# Patient Record
Sex: Female | Born: 1985 | Race: Black or African American | Hispanic: No | Marital: Single | Smoking: Never smoker
Health system: Southern US, Community
[De-identification: ages and names within clinical notes are randomized; demographics above are authoritative.]

---

## 2008-03-06 ENCOUNTER — Inpatient Hospital Stay (HOSPITAL_COMMUNITY): Admission: AD | Admit: 2008-03-06 | Discharge: 2008-03-08 | Payer: Self-pay | Admitting: Obstetrics

## 2008-03-07 ENCOUNTER — Encounter (INDEPENDENT_AMBULATORY_CARE_PROVIDER_SITE_OTHER): Payer: Self-pay | Admitting: Obstetrics

## 2010-06-27 ENCOUNTER — Inpatient Hospital Stay (HOSPITAL_COMMUNITY): Admission: AD | Admit: 2010-06-27 | Discharge: 2010-06-27 | Payer: Self-pay | Admitting: Obstetrics & Gynecology

## 2010-06-27 ENCOUNTER — Ambulatory Visit: Payer: Self-pay | Admitting: Obstetrics and Gynecology

## 2010-11-25 LAB — URINE MICROSCOPIC-ADD ON

## 2010-11-25 LAB — URINALYSIS, ROUTINE W REFLEX MICROSCOPIC
Glucose, UA: NEGATIVE mg/dL
Ketones, ur: NEGATIVE mg/dL
Protein, ur: NEGATIVE mg/dL
Urobilinogen, UA: 1 mg/dL (ref 0.0–1.0)

## 2010-11-25 LAB — POCT PREGNANCY, URINE: Preg Test, Ur: POSITIVE

## 2011-01-26 NOTE — Op Note (Signed)
NAMETASHANA, HABERL                 ACCOUNT NO.:  [id number]   MEDICAL RECORD NO.:  [id number]          PATIENT TYPE:  INP   LOCATION:  9312                          FACILITY:  [facility]   PHYSICIAN:  Kathreen Cosier, M.D.DATE OF BIRTH:  03-Dec-1985   DATE OF PROCEDURE:  03/07/2008  DATE OF DISCHARGE:                               OPERATIVE REPORT   The patient is a 25 year old  primigravida 1, [facility] March 05, 2008, who is  brought in for induction on March 06, 2008 and pushed for an hour to +3  station and her quality of pushes was poor.  A vacuum was applied with  less pressure, 1+ restationed through 1 push to effect delivery.  LOA  female, nuchal cord which was loose cut prior to delivery.  Apgars were 2  and 7.  Team was called and present through the first-degree perineal  which was repaired with 2-0 Vicryl and the placenta was spontaneously  sent to pathology.           ______________________________  Kathreen Cosier, M.D.     BAM/MEDQ  D:  03/07/2008  T:  03/07/2008  Job:  [id number]

## 2011-01-28 ENCOUNTER — Inpatient Hospital Stay (HOSPITAL_COMMUNITY)
Admission: RE | Admit: 2011-01-28 | Discharge: 2011-01-30 | DRG: 775 | Disposition: A | Discharge status: Home / Self Care | Payer: Medicaid Other | Source: Ambulatory Visit | Attending: Obstetrics | Admitting: Obstetrics

## 2011-01-28 DIAGNOSIS — Z22330 Carrier of Group B streptococcus: Secondary | ICD-10-CM

## 2011-01-28 DIAGNOSIS — O99892 Other specified diseases and conditions complicating childbirth: Principal | ICD-10-CM | POA: Diagnosis present

## 2011-01-28 LAB — CBC
HCT: 34.1 % — ABNORMAL LOW (ref 36.0–46.0)
Hemoglobin: 10.8 g/dL — ABNORMAL LOW (ref 12.0–15.0)
MCHC: 31.7 g/dL (ref 30.0–36.0)
RBC: 4.34 MIL/uL (ref 3.87–5.11)
WBC: 11.5 10*3/uL — ABNORMAL HIGH (ref 4.0–10.5)

## 2011-01-28 LAB — RPR: RPR Ser Ql: NONREACTIVE

## 2011-01-29 LAB — CBC
HCT: 32.1 % — ABNORMAL LOW (ref 36.0–46.0)
Hemoglobin: 9.9 g/dL — ABNORMAL LOW (ref 12.0–15.0)
MCV: 79.1 fL (ref 78.0–100.0)
RBC: 4.06 MIL/uL (ref 3.87–5.11)
RDW: 14.5 % (ref 11.5–15.5)
WBC: 15.7 10*3/uL — ABNORMAL HIGH (ref 4.0–10.5)

## 2011-02-03 ENCOUNTER — Inpatient Hospital Stay (HOSPITAL_COMMUNITY): Admission: AD | Admit: 2011-02-03 | Payer: Self-pay | Source: Home / Self Care | Admitting: Obstetrics

## 2011-06-10 LAB — CCBB MATERNAL DONOR DRAW

## 2011-06-10 LAB — CBC
HCT: 32.8 — ABNORMAL LOW
HCT: 41.3
Hemoglobin: 13.7
MCHC: 33.3
MCHC: 33.5
MCV: 84
MCV: 84.9
Platelets: 174
RBC: 4.91
RDW: 14.5
WBC: 12.9 — ABNORMAL HIGH

## 2012-06-28 ENCOUNTER — Emergency Department (INDEPENDENT_AMBULATORY_CARE_PROVIDER_SITE_OTHER)
Admission: EM | Admit: 2012-06-28 | Discharge: 2012-06-28 | Disposition: A | Discharge status: Home / Self Care | Payer: Self-pay | Source: Home / Self Care | Attending: Family Medicine | Admitting: Family Medicine

## 2012-06-28 ENCOUNTER — Encounter (HOSPITAL_COMMUNITY): Payer: Self-pay

## 2012-06-28 DIAGNOSIS — L509 Urticaria, unspecified: Secondary | ICD-10-CM

## 2012-06-28 MED ORDER — HYDROXYZINE HCL 25 MG PO TABS: 25.0000 mg | ORAL_TABLET | Freq: Four times a day (QID) | ORAL | Status: DC

## 2012-06-28 MED ORDER — FAMOTIDINE 20 MG PO TABS: 20.0000 mg | ORAL_TABLET | Freq: Two times a day (BID) | ORAL | Status: DC

## 2012-06-28 MED ORDER — METHYLPREDNISOLONE ACETATE 80 MG/ML IJ SUSP
INTRAMUSCULAR | Status: AC
  Filled 2012-06-28: qty 1

## 2012-06-28 MED ORDER — METHYLPREDNISOLONE ACETATE 40 MG/ML IJ SUSP
80.0000 mg | Freq: Once | INTRAMUSCULAR | Status: AC
  Administered 2012-06-28: 80 mg via INTRAMUSCULAR

## 2012-06-28 NOTE — ED Provider Notes (Signed)
History     CSN: [id number]  Arrival date & time 06/28/12  8657   First MD Initiated Contact with Patient 06/28/12 1944      Chief Complaint  Patient presents with  . Urticaria    (Consider location/radiation/quality/duration/timing/severity/associated sxs/prior treatment) Patient is a 26 y.o. female presenting with rash. The history is provided by the patient.  Rash  This is a new problem. The current episode started more than 2 days ago. The problem has not changed since onset.The problem is associated with an unknown factor. There has been no fever. The rash is present on the right wrist, left wrist and left upper leg. The patient is experiencing no pain. Associated symptoms include itching. She has tried antihistamines for the symptoms. The treatment provided mild relief.    History reviewed. No pertinent past medical history.  History reviewed. No pertinent past surgical history.  No family history on file.  History  Substance Use Topics  . Smoking status: Never Smoker   . Smokeless tobacco: Not on file  . Alcohol Use: Yes    OB History    Grav Para Term Preterm Abortions TAB SAB Ect Mult Living                  Review of Systems  Constitutional: Negative.   HENT: Negative.  Negative for trouble swallowing.   Respiratory: Negative.  Negative for shortness of breath.   Cardiovascular: Negative.   Gastrointestinal: Negative.   Skin: Positive for itching and rash.    Allergies  Review of patient's allergies indicates no known allergies.  Home Medications   Current Outpatient Rx  Name Route Sig Dispense Refill  . FAMOTIDINE 20 MG PO TABS Oral Take 1 tablet (20 mg total) by mouth 2 (two) times daily. 30 tablet 0  . HYDROXYZINE HCL 25 MG PO TABS Oral Take 1 tablet (25 mg total) by mouth every 6 (six) hours. 20 tablet 0    BP 111/70  Pulse 72  Temp 99.1 F (37.3 C) (Oral)  Resp 16  SpO2 100%  LMP 06/25/2012  Physical Exam  Nursing note and vitals  reviewed. Constitutional: She is oriented to person, place, and time. She appears well-developed and well-nourished.       Currently no rash or sx.  HENT:  Head: Normocephalic.  Right Ear: External ear normal.  Left Ear: External ear normal.  Mouth/Throat: Oropharynx is clear and moist.  Eyes: Conjunctivae normal are normal. Pupils are equal, round, and reactive to light.  Neck: Normal range of motion. Neck supple.  Cardiovascular: Regular rhythm and normal heart sounds.   Pulmonary/Chest: Breath sounds normal.  Abdominal: Bowel sounds are normal.  Neurological: She is alert and oriented to person, place, and time.  Skin: Skin is warm and dry. No rash noted.  Psychiatric: She has a normal mood and affect.    ED Course  Procedures (including critical care time)  Labs Reviewed - No data to display No results found.   1. Hives       MDM          Linna Hoff, MD 06/28/12 2040

## 2012-06-28 NOTE — ED Notes (Signed)
C/o hives recurring since 06/23/12 states seems to happen every evening approx 9 pm

## 2013-05-08 ENCOUNTER — Emergency Department (INDEPENDENT_AMBULATORY_CARE_PROVIDER_SITE_OTHER)
Admission: EM | Admit: 2013-05-08 | Discharge: 2013-05-08 | Disposition: A | Discharge status: Home / Self Care | Payer: Self-pay | Source: Home / Self Care | Attending: Family Medicine | Admitting: Family Medicine

## 2013-05-08 ENCOUNTER — Encounter (HOSPITAL_COMMUNITY): Payer: Self-pay | Admitting: Emergency Medicine

## 2013-05-08 DIAGNOSIS — IMO0001 Reserved for inherently not codable concepts without codable children: Secondary | ICD-10-CM

## 2013-05-08 DIAGNOSIS — S50362A Insect bite (nonvenomous) of left elbow, initial encounter: Secondary | ICD-10-CM

## 2013-05-08 MED ORDER — TRIAMCINOLONE ACETONIDE 40 MG/ML IJ SUSP
40.0000 mg | Freq: Once | INTRAMUSCULAR | Status: AC
  Administered 2013-05-08: 40 mg via INTRAMUSCULAR

## 2013-05-08 MED ORDER — FLUTICASONE PROPIONATE 0.05 % EX CREA: TOPICAL_CREAM | Freq: Two times a day (BID) | CUTANEOUS | Status: DC

## 2013-05-08 MED ORDER — TRIAMCINOLONE ACETONIDE 40 MG/ML IJ SUSP
INTRAMUSCULAR | Status: AC
  Filled 2013-05-08: qty 1

## 2013-05-08 NOTE — ED Provider Notes (Signed)
CSN: [id number]     Arrival date & time 05/08/13  1329 History   First MD Initiated Contact with Patient 05/08/13 1345     Chief Complaint  Patient presents with  . Insect Bite   (Consider location/radiation/quality/duration/timing/severity/associated sxs/prior Treatment) Patient is a 27 y.o. female presenting with rash. The history is provided by the patient.  Rash Pain severity:  No pain Onset quality:  Sudden Duration:  1 day Chronicity:  New Context comment:  Suspected insect bite to left elbow area.   History reviewed. No pertinent past medical history. History reviewed. No pertinent past surgical history. History reviewed. No pertinent family history. History  Substance Use Topics  . Smoking status: Never Smoker   . Smokeless tobacco: Not on file  . Alcohol Use: Yes   OB History   Grav Para Term Preterm Abortions TAB SAB Ect Mult Living                 Review of Systems  Constitutional: Negative.   Musculoskeletal: Negative.   Skin: Positive for rash. Negative for wound.    Allergies  Review of patient's allergies indicates no known allergies.  Home Medications   Current Outpatient Rx  Name  Route  Sig  Dispense  Refill  . famotidine (PEPCID) 20 MG tablet   Oral   Take 1 tablet (20 mg total) by mouth 2 (two) times daily.   30 tablet   0   . fluticasone (CUTIVATE) 0.05 % cream   Topical   Apply topically 2 (two) times daily.   30 g   1   . hydrOXYzine (ATARAX/VISTARIL) 25 MG tablet   Oral   Take 1 tablet (25 mg total) by mouth every 6 (six) hours.   20 tablet   0    BP 131/94  Pulse 90  Temp(Src) 98.4 F (36.9 C) (Oral)  Resp 16  SpO2 100% Physical Exam  Nursing note and vitals reviewed. Constitutional: She is oriented to person, place, and time. She appears well-developed and well-nourished.  Musculoskeletal: Normal range of motion.  Neurological: She is alert and oriented to person, place, and time.  Skin: Skin is warm and dry.  Local  sts and warmth and erythema to left elbow prox forearm area, no infection, full rom.    ED Course  Procedures (including critical care time) Labs Review Labs Reviewed - No data to display Imaging Review No results found.  MDM   1. Insect bite of elbow with local reaction, left, initial encounter       Linna Hoff, MD 05/08/13 1400

## 2013-05-08 NOTE — ED Notes (Signed)
C/o insect bite on left arm.  Patient states arm went numb.  Arm swelled this morning.

## 2014-04-18 ENCOUNTER — Encounter (HOSPITAL_COMMUNITY): Payer: Self-pay | Admitting: *Deleted

## 2014-04-18 ENCOUNTER — Inpatient Hospital Stay (HOSPITAL_COMMUNITY)
Admission: AD | Admit: 2014-04-18 | Discharge: 2014-04-18 | Disposition: A | Discharge status: Home / Self Care | Payer: Self-pay | Source: Ambulatory Visit | Attending: Obstetrics & Gynecology | Admitting: Obstetrics & Gynecology

## 2014-04-18 DIAGNOSIS — G44219 Episodic tension-type headache, not intractable: Secondary | ICD-10-CM

## 2014-04-18 DIAGNOSIS — R112 Nausea with vomiting, unspecified: Secondary | ICD-10-CM | POA: Insufficient documentation

## 2014-04-18 DIAGNOSIS — R51 Headache: Secondary | ICD-10-CM | POA: Insufficient documentation

## 2014-04-18 LAB — URINE MICROSCOPIC-ADD ON

## 2014-04-18 LAB — URINALYSIS, ROUTINE W REFLEX MICROSCOPIC
Bilirubin Urine: NEGATIVE
GLUCOSE, UA: NEGATIVE mg/dL
Ketones, ur: 40 mg/dL — AB
LEUKOCYTES UA: NEGATIVE
NITRITE: NEGATIVE
PH: 5.5 (ref 5.0–8.0)
PROTEIN: NEGATIVE mg/dL
Specific Gravity, Urine: >1.03 — ABNORMAL HIGH (ref 1.005–1.030)
Urobilinogen, UA: 0.2 mg/dL (ref 0.0–1.0)

## 2014-04-18 LAB — POCT PREGNANCY, URINE: PREG TEST UR: NEGATIVE

## 2014-04-18 MED ORDER — HYDROMORPHONE HCL PF 1 MG/ML IJ SOLN
1.0000 mg | Freq: Once | INTRAMUSCULAR | Status: AC
  Administered 2014-04-18: 1 mg via INTRAMUSCULAR
  Filled 2014-04-18: qty 1

## 2014-04-18 MED ORDER — OXYCODONE-ACETAMINOPHEN 5-325 MG PO TABS: 1.0000 | ORAL_TABLET | Freq: Once | ORAL | Status: DC

## 2014-04-18 MED ORDER — ONDANSETRON 8 MG PO TBDP
8.0000 mg | ORAL_TABLET | Freq: Once | ORAL | Status: AC
  Administered 2014-04-18: 8 mg via ORAL
  Filled 2014-04-18: qty 1

## 2014-04-18 MED ORDER — METOCLOPRAMIDE HCL 10 MG PO TABS: 10.0000 mg | ORAL_TABLET | Freq: Once | ORAL | Status: DC

## 2014-04-18 MED ORDER — KETOROLAC TROMETHAMINE 60 MG/2ML IM SOLN
60.0000 mg | Freq: Once | INTRAMUSCULAR | Status: AC
  Administered 2014-04-18: 60 mg via INTRAMUSCULAR
  Filled 2014-04-18: qty 2

## 2014-04-18 NOTE — MAU Note (Signed)
Patient states she has had a headache since 8-4. Has taken multiple OTC medications without relief. States she has had nausea and vomiting with the headaches and might be pregnant. Denies abdominal pain, bleeding or discharge.

## 2014-04-18 NOTE — Discharge Instructions (Signed)

## 2014-04-18 NOTE — MAU Note (Signed)
Patient states she 'feels much better' since Dilaudid injection. Expresses desire to go home and eat. Artelia LarocheM. Williams CNM made aware.

## 2014-04-18 NOTE — MAU Provider Note (Signed)
History     CSN: [id number]  Arrival date and time: 04/18/14 1118   First Provider Initiated Contact with Patient 04/18/14 1158      Chief Complaint  Patient presents with  . Headache  . Possible Pregnancy  . Nausea  . Emesis   HPI This is a 28 y.o. female who presents with c/o headache for 2 days. States has never had headaches before. Denies visual changes, weakness or numbness. Does have nausea/vomiting which started with headache. Denies fever.   RN Note:  Patient states she has had a headache since 8-4. Has taken multiple OTC medications without relief. States she has had nausea and vomiting with the headaches and might be pregnant. Denies abdominal pain, bleeding or discharge.        OB History   Grav Para Term Preterm Abortions TAB SAB Ect Mult Living   3 2 2  1 1    3       History reviewed. No pertinent past medical history.  History reviewed. No pertinent past surgical history.  History reviewed. No pertinent family history.  History  Substance Use Topics  . Smoking status: Never Smoker   . Smokeless tobacco: Not on file  . Alcohol Use: Yes    Allergies: No Known Allergies  Prescriptions prior to admission  Medication Sig Dispense Refill  . acetaminophen (TYLENOL) 500 MG tablet Take 1,000 mg by mouth every 6 (six) hours as needed for headache.      Marland Kitchen. aspirin-acetaminophen-caffeine (EXCEDRIN MIGRAINE) 250-250-65 MG per tablet Take 2 tablets by mouth every 6 (six) hours as needed for headache.      . Aspirin-Acetaminophen-Caffeine (GOODY HEADACHE PO) Take 1 packet by mouth daily as needed (headache).      Marland Kitchen. ibuprofen (ADVIL,MOTRIN) 200 MG tablet Take 400 mg by mouth every 6 (six) hours as needed.        Review of Systems  Constitutional: Negative for fever and chills.  Gastrointestinal: Positive for nausea and vomiting. Negative for abdominal pain, diarrhea and constipation.  Neurological: Positive for headaches. Negative for dizziness, sensory change,  speech change, focal weakness, seizures and loss of consciousness.   Physical Exam   Blood pressure 129/79, pulse 99, temperature 99.6 F (37.6 C), temperature source Oral, resp. rate 16, height 5' 2.5" (1.588 m), weight 70.58 kg (155 lb 9.6 oz), SpO2 99.00%.  Physical Exam  Constitutional: She is oriented to person, place, and time. She appears well-developed and well-nourished. No distress.  HENT:  Head: Normocephalic.  Eyes: Pupils are equal, round, and reactive to light.  Neck: Normal range of motion. Neck supple.  Cardiovascular: Normal rate.   Respiratory: Effort normal.  Musculoskeletal: Normal range of motion.  Neurological: She is alert and oriented to person, place, and time. She exhibits normal muscle tone. Coordination normal.  Skin: Skin is warm and dry.  Psychiatric: She has a normal mood and affect.   Results for orders placed during the hospital encounter of 04/18/14 (from the past 24 hour(s))  URINALYSIS, ROUTINE W REFLEX MICROSCOPIC     Status: Abnormal   Collection Time    04/18/14 11:40 AM      Result Value Ref Range   Color, Urine YELLOW  YELLOW   APPearance HAZY (*) CLEAR   Specific Gravity, Urine >1.030 (*) 1.005 - 1.030   pH 5.5  5.0 - 8.0   Glucose, UA NEGATIVE  NEGATIVE mg/dL   Hgb urine dipstick MODERATE (*) NEGATIVE   Bilirubin Urine NEGATIVE  NEGATIVE  Ketones, ur 40 (*) NEGATIVE mg/dL   Protein, ur NEGATIVE  NEGATIVE mg/dL   Urobilinogen, UA 0.2  0.0 - 1.0 mg/dL   Nitrite NEGATIVE  NEGATIVE   Leukocytes, UA NEGATIVE  NEGATIVE  URINE MICROSCOPIC-ADD ON     Status: Abnormal   Collection Time    04/18/14 11:40 AM      Result Value Ref Range   Squamous Epithelial / LPF MANY (*) RARE   WBC, UA 3-6  <3 WBC/hpf   RBC / HPF 0-2  <3 RBC/hpf   Bacteria, UA FEW (*) RARE  POCT PREGNANCY, URINE     Status: None   Collection Time    04/18/14 11:42 AM      Result Value Ref Range   Preg Test, Ur NEGATIVE  NEGATIVE     MAU Course   Procedures  MDM Given zofran ODT and Toradol 60mg  IM.  States got relief from "10" to "8".  Will give one injection of Dilaudid. If no relief, will transfer.  >> got almost complete relief to a '2"  Assessment and Plan  A:  Headache, relieved  P:  Discharge home       Followup with Family Doctor  Ms [facility] 04/18/2014, 12:34 PM

## 2014-07-15 ENCOUNTER — Encounter (HOSPITAL_COMMUNITY): Payer: Self-pay | Admitting: *Deleted

## 2014-12-15 ENCOUNTER — Emergency Department (INDEPENDENT_AMBULATORY_CARE_PROVIDER_SITE_OTHER)
Admission: EM | Admit: 2014-12-15 | Discharge: 2014-12-15 | Disposition: A | Discharge status: Home / Self Care | Payer: Self-pay | Source: Home / Self Care | Attending: Emergency Medicine | Admitting: Emergency Medicine

## 2014-12-15 ENCOUNTER — Encounter (HOSPITAL_COMMUNITY): Payer: Self-pay | Admitting: Emergency Medicine

## 2014-12-15 DIAGNOSIS — J301 Allergic rhinitis due to pollen: Secondary | ICD-10-CM

## 2014-12-15 DIAGNOSIS — H109 Unspecified conjunctivitis: Secondary | ICD-10-CM

## 2014-12-15 DIAGNOSIS — R0982 Postnasal drip: Secondary | ICD-10-CM

## 2014-12-15 MED ORDER — TOBRAMYCIN-DEXAMETHASONE 0.3-0.1 % OP OINT: TOPICAL_OINTMENT | OPHTHALMIC | Status: DC

## 2014-12-15 NOTE — Discharge Instructions (Signed)
Conjunctivitis Conjunctivitis is commonly called "pink eye." Conjunctivitis can be caused by bacterial or viral infection, allergies, or injuries. There is usually redness of the lining of the eye, itching, discomfort, and sometimes discharge. There may be deposits of matter along the eyelids. A viral infection usually causes a watery discharge, while a bacterial infection causes a yellowish, thick discharge. Pink eye is very contagious and spreads by direct contact. You may be given antibiotic eyedrops as part of your treatment. Before using your eye medicine, remove all drainage from the eye by washing gently with warm water and cotton balls. Continue to use the medication until you have awakened 2 mornings in a row without discharge from the eye. Do not rub your eye. This increases the irritation and helps spread infection. Use separate towels from other household members. Wash your hands with soap and water before and after touching your eyes. Use cold compresses to reduce pain and sunglasses to relieve irritation from light. Do not wear contact lenses or wear eye makeup until the infection is gone. SEEK MEDICAL CARE IF:   Your symptoms are not better after 3 days of treatment.  You have increased pain or trouble seeing.  The outer eyelids become very red or swollen. Document Released: 10/07/2004 Document Revised: 11/22/2011 Document Reviewed: 08/30/2005 [facility] Patient Information 2015 [place], [place]. This information is not intended to replace advice given to you by your health care provider. Make sure you discuss any questions you have with your health care provider.  Allergic Rhinitis Use allegra or zyrtec or claritin for drainge Flonase or rhinocort as directed Allergic rhinitis is when the mucous membranes in the nose respond to allergens. Allergens are particles in the air that cause your body to have an allergic reaction. This causes you to release allergic antibodies. Through a chain  of events, these eventually cause you to release histamine into the blood stream. Although meant to protect the body, it is this release of histamine that causes your discomfort, such as frequent sneezing, congestion, and an itchy, runny nose.  CAUSES  Seasonal allergic rhinitis (hay fever) is caused by pollen allergens that may come from grasses, trees, and weeds. Year-round allergic rhinitis (perennial allergic rhinitis) is caused by allergens such as house dust mites, pet dander, and mold spores.  SYMPTOMS   Nasal stuffiness (congestion).  Itchy, runny nose with sneezing and tearing of the eyes. DIAGNOSIS  Your health care provider can help you determine the allergen or allergens that trigger your symptoms. If you and your health care provider are unable to determine the allergen, skin or blood testing may be used. TREATMENT  Allergic rhinitis does not have a cure, but it can be controlled by:  Medicines and allergy shots (immunotherapy).  Avoiding the allergen. Hay fever may often be treated with antihistamines in pill or nasal spray forms. Antihistamines block the effects of histamine. There are over-the-counter medicines that may help with nasal congestion and swelling around the eyes. Check with your health care provider before taking or giving this medicine.  If avoiding the allergen or the medicine prescribed do not work, there are many new medicines your health care provider can prescribe. Stronger medicine may be used if initial measures are ineffective. Desensitizing injections can be used if medicine and avoidance does not work. Desensitization is when a patient is given ongoing shots until the body becomes less sensitive to the allergen. Make sure you follow up with your health care provider if problems continue. HOME CARE INSTRUCTIONS It  is not possible to completely avoid allergens, but you can reduce your symptoms by taking steps to limit your exposure to them. It helps to know  exactly what you are allergic to so that you can avoid your specific triggers. SEEK MEDICAL CARE IF:   You have a fever.  You develop a cough that does not stop easily (persistent).  You have shortness of breath.  You start wheezing.  Symptoms interfere with normal daily activities. Document Released: 05/25/2001 Document Revised: 09/04/2013 Document Reviewed: 05/07/2013 [facility] Patient Information 2015 [place], [place]. This information is not intended to replace advice given to you by your health care provider. Make sure you discuss any questions you have with your health care provider.

## 2014-12-15 NOTE — ED Notes (Signed)
Pt states her eye has been red and swollen since 12/11/2014. She states that she believes she got something in her eye at work which has caused her eye to be irritated.

## 2014-12-15 NOTE — ED Provider Notes (Addendum)
CSN: [id number]     Arrival date & time 12/15/14  1301 History   First MD Initiated Contact with Patient 12/15/14 1332     Chief Complaint  Patient presents with  . Eye Problem   (Consider location/radiation/quality/duration/timing/severity/associated sxs/prior Treatment) HPI Comments: 29 year old female complaining of soreness in the left eye 4 days. She has had a clear watery drainage in the morning has a thick or crustier a tight build up around her eyes. She is also complaining of PND. Is associated with her seasonal allergies.  Patient is a 29 y.o. female presenting with eye problem.  Eye Problem Associated symptoms: discharge and redness     History reviewed. No pertinent past medical history. History reviewed. No pertinent past surgical history. History reviewed. No pertinent family history. History  Substance Use Topics  . Smoking status: Never Smoker   . Smokeless tobacco: Not on file  . Alcohol Use: Yes   OB History    Gravida Para Term Preterm AB TAB SAB Ectopic Multiple Living   Review of Systems  Constitutional: Negative for fever, activity change and fatigue.  HENT: Positive for congestion, postnasal drip and rhinorrhea. Negative for sore throat.   Eyes: Positive for pain, discharge and redness.       Mild blurry vision left eye.  Respiratory: Negative.   Cardiovascular: Negative.   Skin: Negative for rash.    Allergies  Review of patient's allergies indicates no known allergies.  Home Medications   Prior to Admission medications   Medication Sig Start Date End Date Taking? Authorizing Provider  acetaminophen (TYLENOL) 500 MG tablet Take 1,000 mg by mouth every 6 (six) hours as needed for headache.    Historical Provider, MD  aspirin-acetaminophen-caffeine (EXCEDRIN MIGRAINE) [phone] MG per tablet Take 2 tablets by mouth every 6 (six) hours as needed for headache.    Historical Provider, MD  Aspirin-Acetaminophen-Caffeine (GOODY  HEADACHE PO) Take 1 packet by mouth daily as needed (headache).    Historical Provider, MD  ibuprofen (ADVIL,MOTRIN) 200 MG tablet Take 400 mg by mouth every 6 (six) hours as needed.    Historical Provider, MD  tobramycin-dexamethasone Wallene Dales) ophthalmic ointment Apply 1/2 inch ribbon to the lower lid of left eye tid 12/15/14   Hayden Rasmussen, NP   BP 129/80 mmHg  Pulse 106  Temp(Src) 98.6 F (37 C) (Oral)  Resp 18  SpO2 99%  LMP 11/13/2014 Physical Exam  Constitutional: She is oriented to person, place, and time. She appears well-developed and well-nourished. No distress.  HENT:  Mouth/Throat: No oropharyngeal exudate.  OP minor erythema and moderate amount of clear PND.  Eyes:  Left upper and lower lid puffiness. Upper and lower lid conjunctivitis. Minor scleral injection. Under magnification there is approximately 2 mm corneal abrasion at the 2:00 position. No foreign bodies are seen. Upper lid was inverted. No foreign bodies seen.  Neck: Normal range of motion.  Cardiovascular: Normal rate.   Pulmonary/Chest: Effort normal.  Lymphadenopathy:    She has no cervical adenopathy.  Neurological: She is alert and oriented to person, place, and time.  Skin: Skin is warm and dry.  Psychiatric: She has a normal mood and affect.  Vitals reviewed.   ED Course  Procedures (including critical care time) Labs Review Labs Reviewed - No data to display  Imaging Review No results found.   MDM   1. Conjunctivitis of left eye   2. PND (post-nasal  drip)   3. Allergic rhinitis due to pollen    Use allegra or zyrtec or claritin for drainge Flonase or rhinocort as directed tobradex OS    Hayden Rasmussenavid Joselyn Edling, NP 12/15/14 1359  Hayden Rasmussenavid Tayte Childers, NP 12/15/14 1402

## 2015-02-16 ENCOUNTER — Emergency Department (INDEPENDENT_AMBULATORY_CARE_PROVIDER_SITE_OTHER)
Admission: EM | Admit: 2015-02-16 | Discharge: 2015-02-16 | Disposition: A | Discharge status: Home / Self Care | Payer: Self-pay | Source: Home / Self Care | Attending: Family Medicine | Admitting: Family Medicine

## 2015-02-16 ENCOUNTER — Encounter (HOSPITAL_COMMUNITY): Payer: Self-pay | Admitting: Emergency Medicine

## 2015-02-16 DIAGNOSIS — M778 Other enthesopathies, not elsewhere classified: Secondary | ICD-10-CM

## 2015-02-16 DIAGNOSIS — M779 Enthesopathy, unspecified: Secondary | ICD-10-CM

## 2015-02-16 NOTE — Discharge Instructions (Signed)
Tendinitis Tendinitis is swelling and inflammation of the tendons. Tendons are band-like tissues that connect muscle to bone. Tendinitis commonly occurs in the:   Shoulders (rotator cuff).  Heels (Achilles tendon).  Elbows (triceps tendon). CAUSES Tendinitis is usually caused by overusing the tendon, muscles, and joints involved. When the tissue surrounding a tendon (synovium) becomes inflamed, it is called tenosynovitis. Tendinitis commonly develops in people whose jobs require repetitive motions. SYMPTOMS  Pain.  Tenderness.  Mild swelling. DIAGNOSIS Tendinitis is usually diagnosed by physical exam. Your health care provider may also order X-rays or other imaging tests. TREATMENT Your health care provider may recommend certain medicines or exercises for your treatment. HOME CARE INSTRUCTIONS   Use a sling or splint for as long as directed by your health care provider until the pain decreases.  Put ice on the injured area.  Put ice in a plastic bag.  Place a towel between your skin and the bag.  Leave the ice on for 15-20 minutes, 3-4 times a day, or as directed by your health care provider.  Avoid using the limb while the tendon is painful. Perform gentle range of motion exercises only as directed by your health care provider. Stop exercises if pain or discomfort increase, unless directed otherwise by your health care provider.  Only take over-the-counter or prescription medicines for pain, discomfort, or fever as directed by your health care provider. SEEK MEDICAL CARE IF:   Your pain and swelling increase.  You develop new, unexplained symptoms, especially increased numbness in the hands. MAKE SURE YOU:   Understand these instructions.  Will watch your condition.  Will get help right away if you are not doing well or get worse. Document Released: 08/27/2000 Document Revised: 01/14/2014 Document Reviewed: 11/16/2010 [facility] Patient Information 2015 [employer],  [place]. This information is not intended to replace advice given to you by your health care provider. Make sure you discuss any questions you have with your health care provider.  Tendinitis and Tenosynovitis  Tendinitis is inflammation of the tendon. Tenosynovitis is inflammation of the lining around the tendon (tendon sheath). These painful conditions often occur at once. Tendons attach muscle to bone. To move a limb, force from the muscle moves through the tendon, to the bone. These conditions often cause increased pain when moving. Tendinitis may be caused by a small or partial tear in the tendon.  SYMPTOMS   Pain, tenderness, redness, bruising, or swelling at the injury.  Loss of normal joint movement.  Pain that gets worse with use of the muscle and joint attached to the tendon.  Weakness in the tendon, caused by calcium build up that may occur with tendinitis.  Commonly affected tendons:  Achilles tendon (calf of leg).  Rotator cuff (shoulder joint).  Patellar tendon (kneecap to shin).  Peroneal tendon (ankle).  Posterior tibial tendon (inner ankle).  Biceps tendon (in front of shoulder). CAUSES   Sudden strain on a flexed muscle, muscle overuse, sudden increase or change in activity, vigorous activity.  Result of a direct hit (less common).  Poor muscle action (biomechanics). RISK INCREASES WITH:  Injury (trauma).  Too much exercise.  Sudden change in athletic activity.  Incorrect exercise form or technique.  Poor strength and flexibility.  Not warming-up properly before activity.  Returning to activity before healing is complete. PREVENTION   Warm-up and stretch properly before activity.  Maintain physical fitness:  Joint flexibility.  Muscle strength and endurance.  Fitness that increases heart rate.  Learn and use proper exercise  techniques. °· Use rehabilitation exercises to strengthen weak muscles and tendons. °· Ice the tendon after activity, to  reduce recurring inflammation. °· Wear proper fitting protective equipment for specific tendons, when indicated. °PROGNOSIS  °When treated properly, can be cured in 6 to 8 weeks. Recovery may take longer, depending on degree of injury.  °RELATED COMPLICATIONS  °· Re-injury or recurring symptoms. °· Permanent weakness or joint stiffness, if injury is severe and recovery is not completed. °· Delayed healing, if sports are started before healing is complete. °· Tearing apart (rupture) of the inflamed tendon. Tendinitis means the tendon is injured and must recover. °TREATMENT  °Treatment first involves ice, medicine, and rest from aggravating activities. This reduces pain and inflammation. Modifying your activity may be considered to prevent recurring injury. A brace, elastic bandage wrap, splint, cast, or sling may be prescribed to protect the joint for a short period. After that period, strengthening and stretching exercise may help to regain strength and full range of motion. If the condition persists, despite non-surgical treatment, surgery may be recommended to remove the inflamed tendon lining. Corticosteroid injections may be given to reduce inflammation. However, these injections may weaken the tendon and increase your risk for tendon rupture. °MEDICATION  °· If pain medicine is needed, nonsteroidal anti-inflammatory medicines (aspirin and ibuprofen), or other minor pain relievers (acetaminophen), are often recommended. °· Do not take pain medicine for 7 days before surgery. °· Prescription pain relievers are usually prescribed only after surgery. Use only as directed and only as much as you need. °· Ointments applied to the skin may be helpful. °· Corticosteroid injections may be given to reduce inflammation. However, this may increase your risk of a tendon rupture. °HEAT AND COLD °· Cold treatment (icing) relieves pain and reduces inflammation. Cold treatment should be applied for 10 to 15 minutes every 2 to 3  hours, and immediately after activity that aggravates your symptoms. Use ice packs or an ice massage. °· Heat treatment may be used before performing stretching and strengthening activities prescribed by your caregiver, physical therapist, or athletic trainer. Use a heat pack or a warm water soak. °SEEK MEDICAL CARE IF:  °· Symptoms get worse or do not improve, despite treatment. °· Pain becomes too much to tolerate. °· You develop numbness or tingling. °· Toes become cold, or toenails become blue, gray, or dark colored. °· New, unexplained symptoms develop. (Drugs used in treatment may produce side effects.) °Document Released: 08/30/2005 Document Revised: 11/22/2011 Document Reviewed: 12/12/2008 °ExitCare® Patient Information ©2015 [employer]. This information is not intended to replace advice given to you by your health care provider. Make sure you discuss any questions you have with your health care provider. ° °

## 2015-02-16 NOTE — ED Provider Notes (Signed)
CSN: [id number]     Arrival date & time 02/16/15  1925 History   First MD Initiated Contact with Patient 02/16/15 2010     Chief Complaint  Patient presents with  . Hand Problem   (Consider location/radiation/quality/duration/timing/severity/associated sxs/prior Treatment) HPI Comments: 29 year old female is complaining of a sore swollen right middle finger. She denies any known injury. She states that one week ago he was getting stiff and then in the past 2-3 days is developed swelling. Denies any type of wound. She has been lifting boxes more so in the past 2-3 days when the swelling has increased. It is painful to flex and extend at the PIP and DIP. She has noticed no erythema or other discoloration.   History reviewed. No pertinent past medical history. History reviewed. No pertinent past surgical history. History reviewed. No pertinent family history. History  Substance Use Topics  . Smoking status: Never Smoker   . Smokeless tobacco: Not on file  . Alcohol Use: Yes   OB History    Gravida Para Term Preterm AB TAB SAB Ectopic Multiple Living   Review of Systems  Constitutional: Negative.   HENT: Negative.   Musculoskeletal: Positive for joint swelling. Negative for myalgias and back pain.  Skin: Negative.   Neurological: Negative.     Allergies  Review of patient's allergies indicates no known allergies.  Home Medications   Prior to Admission medications   Medication Sig Start Date End Date Taking? Authorizing Provider  acetaminophen (TYLENOL) 500 MG tablet Take 1,000 mg by mouth every 6 (six) hours as needed for headache.    Historical Provider, MD  aspirin-acetaminophen-caffeine (EXCEDRIN MIGRAINE) [phone] MG per tablet Take 2 tablets by mouth every 6 (six) hours as needed for headache.    Historical Provider, MD  Aspirin-Acetaminophen-Caffeine (GOODY HEADACHE PO) Take 1 packet by mouth daily as needed (headache).    Historical Provider, MD   ibuprofen (ADVIL,MOTRIN) 200 MG tablet Take 400 mg by mouth every 6 (six) hours as needed.    Historical Provider, MD  tobramycin-dexamethasone Wallene Dales) ophthalmic ointment Apply 1/2 inch ribbon to the lower lid of left eye tid 12/15/14   Hayden Rasmussen, NP   LMP 02/12/2015 Physical Exam  Constitutional: She is oriented to person, place, and time. She appears well-developed and well-nourished.  Neck: Normal range of motion. Neck supple.  Pulmonary/Chest: Effort normal. No respiratory distress.  Musculoskeletal:  Swelling to the left long finger, primarily to the middle and proximal phalanx and PIP. Tenderness to the PIP and lesser to the DIP. Active flexion at the joints approximately 30. Able to flex and extend with minimal resistance only. Palpation reveals tenderness to the PIP and DIP as well as the middle and proximal phalanx. No involvement or symptoms to the MCP. No distal phalanges symptoms. Distal neurovascular and sensory is intact. Capillary refill is brisk. No deformity. No erythema or other discoloration. Passive flexion increases with the DIP and PIP.  Neurological: She is alert and oriented to person, place, and time. She exhibits normal muscle tone.  Skin: Skin is warm and dry. No erythema.  Psychiatric: She has a normal mood and affect.  Nursing note and vitals reviewed.   ED Course  Procedures (including critical care time) Labs Review Labs Reviewed - No data to display  Imaging Review No results found.   MDM   1. Tendinitis of finger    there is no erythema. No  signs of infection. No history of injury or mechanism of injury. 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 This is when her finger began to have swelling and pain when her workload increased in the past 3 days. For worsening symptoms , swelling, redness, increase pain, not getting better in 3-4 days. Rechk promptly. May  return. Splint in POF . May remove daily and slowly move finger, flex/xt. We discussed signs and symptoms of tenosynovitis and in particular infectious tenosynovitis. She states she stands and will return immediately if she notices any of the symptoms.    Hayden Rasmussenavid Zissel Biederman, NP 02/16/15 2042

## 2015-02-16 NOTE — ED Notes (Signed)
Reports having pain in right middle finger over a week ago.  Denies any known injury.  States for the past 3 days finger has become swollen with limited ROM.

## 2016-12-22 ENCOUNTER — Encounter (HOSPITAL_COMMUNITY): Payer: Self-pay | Admitting: Emergency Medicine

## 2016-12-22 ENCOUNTER — Ambulatory Visit (HOSPITAL_COMMUNITY)
Admission: EM | Admit: 2016-12-22 | Discharge: 2016-12-22 | Disposition: A | Discharge status: Home / Self Care | Payer: Medicaid Other | Attending: Emergency Medicine | Admitting: Emergency Medicine

## 2016-12-22 DIAGNOSIS — J0110 Acute frontal sinusitis, unspecified: Secondary | ICD-10-CM

## 2016-12-22 DIAGNOSIS — J301 Allergic rhinitis due to pollen: Secondary | ICD-10-CM

## 2016-12-22 MED ORDER — FLUTICASONE PROPIONATE 50 MCG/ACT NA SUSP: 1.0000 | Freq: Every day | NASAL | 2 refills | Status: DC

## 2016-12-22 MED ORDER — CETIRIZINE-PSEUDOEPHEDRINE ER 5-120 MG PO TB12: 1.0000 | ORAL_TABLET | Freq: Every day | ORAL | 0 refills | Status: DC

## 2016-12-22 MED ORDER — PREDNISONE 10 MG (21) PO TBPK: ORAL_TABLET | Freq: Every day | ORAL | 0 refills | Status: DC

## 2016-12-22 NOTE — ED Provider Notes (Signed)
CSN: [id number]     Arrival date & time 12/22/16  1705 History   First MD Initiated Contact with Patient 12/22/16 1723     Chief Complaint  Patient presents with  . Sinusitis   (Consider location/radiation/quality/duration/timing/severity/associated sxs/prior Treatment) Pt is here for 3 days of runny nose, intermit cough. States that she does have some seasonal allergies at times. Denies any fever,no headache, no sore throat. Has taken OTC meds with minimal relief.       History reviewed. No pertinent past medical history. History reviewed. No pertinent surgical history. Family History  Problem Relation Age of Onset  . Aneurysm Father    Social History  Substance Use Topics  . Smoking status: Never Smoker  . Smokeless tobacco: Never Used  . Alcohol use Yes   OB History    Gravida Para Term Preterm AB Living   SAB TAB Ectopic Multiple Live Births     1     2     Review of Systems  Constitutional: Negative.   HENT: Positive for postnasal drip, rhinorrhea, sinus pressure and sneezing.   Eyes: Negative.   Respiratory: Negative.   Cardiovascular: Negative.   Gastrointestinal: Negative.   Neurological: Negative.     Allergies  Patient has no known allergies.  Home Medications   Prior to Admission medications   Medication Sig Start Date End Date Taking? Authorizing Provider  acetaminophen (TYLENOL) 500 MG tablet Take 1,000 mg by mouth every 6 (six) hours as needed for headache.    Historical Provider, MD  aspirin-acetaminophen-caffeine (EXCEDRIN MIGRAINE) [phone] MG per tablet Take 2 tablets by mouth every 6 (six) hours as needed for headache.    Historical Provider, MD  Aspirin-Acetaminophen-Caffeine (GOODY HEADACHE PO) Take 1 packet by mouth daily as needed (headache).    Historical Provider, MD  ibuprofen (ADVIL,MOTRIN) 200 MG tablet Take 400 mg by mouth every 6 (six) hours as needed.    Historical Provider, MD  tobramycin-dexamethasone Wallene Dales)  ophthalmic ointment Apply 1/2 inch ribbon to the lower lid of left eye tid 12/15/14   Hayden Rasmussen, NP   Meds Ordered and Administered this Visit  Medications - No data to display  BP 120/77 (BP Location: Right Arm)   Pulse 95   Temp 98.6 F (37 C) (Oral)   LMP 12/20/2016 (Exact Date)   SpO2 95%  No data found.   Physical Exam  Constitutional: She appears well-developed.  HENT:  Head: Normocephalic.  Clear post nasal drip,   Eyes: Pupils are equal, round, and reactive to light.  Neck: Normal range of motion.  Cardiovascular: Normal rate and regular rhythm.   Pulmonary/Chest: Effort normal and breath sounds normal.  Abdominal: Soft.  Neurological: She is alert.  Skin: Skin is warm.    Urgent Care Course     Procedures (including critical care time)  Labs Review Labs Reviewed - No data to display  Imaging Review No results found.           MDM   1. Acute non-recurrent frontal sinusitis   2. Acute seasonal allergic rhinitis due to pollen    May use nasal spray as needed Take motrin as needed for pressure and discomfort  Discussed reason for dx and OTC things to take.     Tobi Bastos, NP 12/22/16 [phone]

## 2016-12-22 NOTE — ED Triage Notes (Signed)
Pt reports left sided sinus pressure and a cough since Monday.

## 2016-12-22 NOTE — Discharge Instructions (Signed)
May use nasal spray as needed Take motrin as needed for pressure and discomfort

## 2018-04-02 ENCOUNTER — Other Ambulatory Visit: Payer: Self-pay

## 2018-04-02 ENCOUNTER — Encounter (HOSPITAL_COMMUNITY): Payer: Self-pay | Admitting: Emergency Medicine

## 2018-04-02 ENCOUNTER — Ambulatory Visit (HOSPITAL_COMMUNITY)
Admission: EM | Admit: 2018-04-02 | Discharge: 2018-04-02 | Disposition: A | Discharge status: Home / Self Care | Payer: Self-pay | Attending: Family Medicine | Admitting: Family Medicine

## 2018-04-02 DIAGNOSIS — S70362A Insect bite (nonvenomous), left thigh, initial encounter: Secondary | ICD-10-CM

## 2018-04-02 DIAGNOSIS — W57XXXA Bitten or stung by nonvenomous insect and other nonvenomous arthropods, initial encounter: Secondary | ICD-10-CM

## 2018-04-02 DIAGNOSIS — L03116 Cellulitis of left lower limb: Secondary | ICD-10-CM

## 2018-04-02 MED ORDER — CEPHALEXIN 500 MG PO CAPS: 500.0000 mg | ORAL_CAPSULE | Freq: Four times a day (QID) | ORAL | 0 refills | Status: DC

## 2018-04-02 NOTE — Discharge Instructions (Signed)
Start keflex as directed for skin infection. Zyrtec to help decrease histamine/itching. Warm compress. Follow up for reevaluation if redness is spreading, worsening pain, fever.

## 2018-04-02 NOTE — ED Provider Notes (Signed)
[facility]    CSN: [id number] Arrival date & time: 04/02/18  1018     History   Chief Complaint Chief Complaint  Patient presents with  . Insect Bite    HPI Linda [name] is a 32 y.o. female.   32 year old female comes in for insect bite to the left lateral thigh.  States sustained insect bite 6 days ago,  and was itching in nature.  States she has tried to avoid itching.  However, few days ago, started having increased redness, increased warmth, and now painful to touch.  She denies fever, chills, night sweats.  Has not taken anything for the symptoms.     History reviewed. No pertinent past medical history.  There are no active problems to display for this patient.   History reviewed. No pertinent surgical history.  OB History    Gravida  3   Para  2   Term  2   Preterm      AB  1   Living  3     SAB      TAB  1   Ectopic      Multiple      Live Births  2            Home Medications    Prior to Admission medications   Medication Sig Start Date End Date Taking? Authorizing Provider  acetaminophen (TYLENOL) 500 MG tablet Take 1,000 mg by mouth every 6 (six) hours as needed for headache.   Yes [provider]  aspirin-acetaminophen-caffeine (EXCEDRIN MIGRAINE) [phone] MG per tablet Take 2 tablets by mouth every 6 (six) hours as needed for headache.   Yes [provider]  Aspirin-Acetaminophen-Caffeine (GOODY HEADACHE PO) Take 1 packet by mouth daily as needed (headache).   Yes [provider]  cetirizine-pseudoephedrine (ZYRTEC-D) 5-120 MG tablet Take 1 tablet by mouth daily. 12/22/16  Yes Coralyn Mark, NP  cephALEXin (KEFLEX) 500 MG capsule Take 1 capsule (500 mg total) by mouth 4 (four) times daily. 04/02/18   Belinda Fisher, PA-C    Family History Family History  Problem Relation Age of Onset  . Aneurysm Father     Social History Social History   Tobacco Use  . Smoking status: Never Smoker  .  Smokeless tobacco: Never Used  Substance Use Topics  . Alcohol use: Yes  . Drug use: No     Allergies   Patient has no known allergies.   Review of Systems Review of Systems  Reason unable to perform ROS: See HPI as above.     Physical Exam Triage Vital Signs ED Triage Vitals [04/02/18 1055]  Enc Vitals Group     BP 125/83     Pulse Rate 92     Resp 16     Temp 99.1 F (37.3 C)     Temp Source Oral     SpO2 100 %     Weight      Height      Head Circumference      Peak Flow      Pain Score 2     Pain Loc      Pain Edu?      Excl. in GC?    No data found.  Updated Vital Signs BP 125/83 (BP Location: Left Arm)   Pulse 92   Temp 99.1 F (37.3 C) (Oral)   Resp 16   SpO2 100%   Physical Exam  Constitutional: She is oriented  to person, place, and time. She appears well-developed and well-nourished. No distress.  HENT:  Head: Normocephalic and atraumatic.  Eyes: Pupils are equal, round, and reactive to light. Conjunctivae are normal.  Neurological: She is alert and oriented to person, place, and time.  Skin: Skin is warm and dry. She is not diaphoretic.  Central wound consistent with insect bite with 3cm x 3cm surrounding erythema. About 4cm x 4cm of swelling without fluctuance. Warm to touch. No fluctuance felt.    UC Treatments / Results  Labs (all labs ordered are listed, but only abnormal results are displayed) Labs Reviewed - No data to display  EKG None  Radiology No results found.  Procedures Procedures (including critical care time)  Medications Ordered in UC Medications - No data to display  Initial Impression / Assessment and Plan / UC Course  I have reviewed the triage vital signs and the nursing notes.  Pertinent labs & imaging results that were available during my care of the patient were reviewed by me and considered in my medical decision making (see chart for details).    Discussed possible cellulitis versus inflammation.  Given  has had  spreading erythema with increased warmth, will cover cellulitis with Keflex.  Return precautions given.  Patient expresses understanding and agrees to plan.  Final Clinical Impressions(s) / UC Diagnoses   Final diagnoses:  Insect bite of left thigh, initial encounter  Cellulitis of left lower extremity    ED Prescriptions    Medication Sig Dispense Auth. Provider   cephALEXin (KEFLEX) 500 MG capsule Take 1 capsule (500 mg total) by mouth 4 (four) times daily. 28 capsule Threasa AlphaYu, Bethan Adamek V, PA-C        Moneka Mcquinn V, [place]PA-C 04/02/18 1145

## 2018-04-02 NOTE — ED Triage Notes (Signed)
[facility] a complaint of a possible spider bite on the top of her left leg 6 days ago. The patient stated that yesterday it became swollen and warm to the touch.

## 2020-09-16 ENCOUNTER — Ambulatory Visit (HOSPITAL_COMMUNITY)
Admission: EM | Admit: 2020-09-16 | Discharge: 2020-09-16 | Disposition: A | Discharge status: Home / Self Care | Payer: Commercial Managed Care - PPO | Attending: Emergency Medicine | Admitting: Emergency Medicine

## 2020-09-16 ENCOUNTER — Other Ambulatory Visit: Payer: Self-pay

## 2020-09-16 ENCOUNTER — Encounter (HOSPITAL_COMMUNITY): Payer: Self-pay

## 2020-09-16 ENCOUNTER — Ambulatory Visit (INDEPENDENT_AMBULATORY_CARE_PROVIDER_SITE_OTHER): Payer: Commercial Managed Care - PPO

## 2020-09-16 DIAGNOSIS — M25511 Pain in right shoulder: Secondary | ICD-10-CM | POA: Diagnosis not present

## 2020-09-16 DIAGNOSIS — W19XXXA Unspecified fall, initial encounter: Secondary | ICD-10-CM | POA: Diagnosis not present

## 2020-09-16 DIAGNOSIS — G8929 Other chronic pain: Secondary | ICD-10-CM | POA: Diagnosis not present

## 2020-09-16 MED ORDER — KETOROLAC TROMETHAMINE 60 MG/2ML IM SOLN
INTRAMUSCULAR | Status: AC
  Filled 2020-09-16: qty 2

## 2020-09-16 MED ORDER — PREDNISONE 10 MG (21) PO TBPK: ORAL_TABLET | Freq: Every day | ORAL | 0 refills | Status: DC

## 2020-09-16 MED ORDER — NAPROXEN 500 MG PO TABS: 500.0000 mg | ORAL_TABLET | Freq: Two times a day (BID) | ORAL | 0 refills | Status: DC

## 2020-09-16 MED ORDER — KETOROLAC TROMETHAMINE 60 MG/2ML IM SOLN
60.0000 mg | Freq: Once | INTRAMUSCULAR | Status: AC
  Administered 2020-09-16: 60 mg via INTRAMUSCULAR

## 2020-09-16 NOTE — ED Provider Notes (Signed)
[facility]    CSN: [id number] Arrival date & time: 09/16/20  1508      History   Chief Complaint Chief Complaint  Patient presents with  . Shoulder Pain    HPI Linda [name] is a 35 y.o. female.   Injured rt should / arm area from a fall several months ago. Worse now and in the cold. Has not had a x ray nor treatment before now. Pain worse would like to have an xray.      History reviewed. No pertinent past medical history.  There are no problems to display for this patient.   History reviewed. No pertinent surgical history.  OB History    Gravida  3   Para  2   Term  2   Preterm      AB  1   Living  3     SAB      IAB  1   Ectopic      Multiple      Live Births  2            Home Medications    Prior to Admission medications   Medication Sig Start Date End Date Taking? Authorizing Provider  naproxen (NAPROSYN) 500 MG tablet Take 1 tablet (500 mg total) by mouth 2 (two) times daily. 09/16/20  Yes Coralyn Mark, NP  predniSONE (STERAPRED UNI-PAK 21 TAB) 10 MG (21) TBPK tablet Take by mouth daily. Take 6 tabs by mouth daily  for 2 days, then 5 tabs for 2 days, then 4 tabs for 2 days, then 3 tabs for 2 days, 2 tabs for 2 days, then 1 tab by mouth daily for 2 days 09/16/20  Yes Coralyn Mark, NP  acetaminophen (TYLENOL) 500 MG tablet Take 1,000 mg by mouth every 6 (six) hours as needed for headache.    [provider]  aspirin-acetaminophen-caffeine (EXCEDRIN MIGRAINE) 4045220346 MG per tablet Take 2 tablets by mouth every 6 (six) hours as needed for headache.    [provider]  Aspirin-Acetaminophen-Caffeine (GOODY HEADACHE PO) Take 1 packet by mouth daily as needed (headache).    [provider]  cephALEXin (KEFLEX) 500 MG capsule Take 1 capsule (500 mg total) by mouth 4 (four) times daily. 04/02/18   Cathie Hoops, Amy V, PA-C  cetirizine-pseudoephedrine (ZYRTEC-D) 5-120 MG tablet Take 1 tablet by mouth daily.  12/22/16   Coralyn Mark, NP    Family History Family History  Problem Relation Age of Onset  . Aneurysm Father     Social History Social History   Tobacco Use  . Smoking status: Never Smoker  . Smokeless tobacco: Never Used  Vaping Use  . Vaping Use: Never used  Substance Use Topics  . Alcohol use: Yes  . Drug use: No     Allergies   Patient has no known allergies.   Review of Systems Review of Systems  Constitutional: Negative.   Respiratory: Negative.   Cardiovascular: Negative.   Gastrointestinal: Negative.   Musculoskeletal:       Shoulder pain to rt side not able to lift arm completely.   Skin: Negative.   Neurological:       Tingling to hands intermit , decrease ROM due to pain      Physical Exam Triage Vital Signs ED Triage Vitals [09/16/20 1714]  Enc Vitals Group     BP      Pulse      Resp  Temp      Temp src      SpO2      Weight      Height      Head Circumference      Peak Flow      Pain Score 9     Pain Loc      Pain Edu?      Excl. in GC?    No data found.  Updated Vital Signs BP 120/72 (BP Location: Left Arm)   Pulse 74   Temp 98.7 F (37.1 C) (Oral)   Resp 18   LMP 09/13/2020   SpO2 99%   Visual Acuity     Physical Exam Musculoskeletal:        General: Tenderness present.     Cervical back: Normal range of motion.     Comments: RT upper arm shoulder area pain with palpation decrease ROM, strong pulses bil. Warm,   Skin:    General: Skin is warm.     Capillary Refill: Capillary refill takes less than 2 seconds.  Neurological:     General: No focal deficit present.     Mental Status: She is alert.      UC Treatments / Results  Labs (all labs ordered are listed, but only abnormal results are displayed) Labs Reviewed - No data to display  EKG   Radiology No results found.  Procedures Procedures (including critical care time)  Medications Ordered in UC Medications  ketorolac (TORADOL)  injection 60 mg (60 mg Intramuscular Given 09/16/20 1827)    Initial Impression / Assessment and Plan / UC Course  I have reviewed the triage vital signs and the nursing notes.  Pertinent labs & imaging results that were available during my care of the patient were reviewed by me and considered in my medical decision making (see chart for details).     You will need to see orthopedic md for further evaluation and test for shoulder Use warm compress to help with pain pain meds  Final Clinical Impressions(s) / UC Diagnoses   Final diagnoses:  Chronic right shoulder pain     Discharge Instructions     You will need to see orthopedic md for further evaluation and test for shoulder Use warm compress to help with pain pain meds      ED Prescriptions    Medication Sig Dispense Auth. Provider   naproxen (NAPROSYN) 500 MG tablet Take 1 tablet (500 mg total) by mouth 2 (two) times daily. 30 tablet Maple Mirza L, NP   predniSONE (STERAPRED UNI-PAK 21 TAB) 10 MG (21) TBPK tablet Take by mouth daily. Take 6 tabs by mouth daily  for 2 days, then 5 tabs for 2 days, then 4 tabs for 2 days, then 3 tabs for 2 days, 2 tabs for 2 days, then 1 tab by mouth daily for 2 days 42 tablet Coralyn Mark, NP     PDMP not reviewed this encounter.   Coralyn Mark, NP 09/19/20 [phone]

## 2020-09-16 NOTE — ED Triage Notes (Signed)
Pt presents with chronic right shoulder pain for past year or two: pt states about a year or so ago she injured it at working lifting some heavy parts at work but never got it assessed by a doctor.

## 2020-09-16 NOTE — Discharge Instructions (Addendum)
You will need to see orthopedic md for further evaluation and test for shoulder Use warm compress to help with pain pain meds

## 2020-09-19 ENCOUNTER — Ambulatory Visit (INDEPENDENT_AMBULATORY_CARE_PROVIDER_SITE_OTHER): Payer: Commercial Managed Care - PPO | Admitting: Orthopedic Surgery

## 2020-09-19 DIAGNOSIS — M25511 Pain in right shoulder: Secondary | ICD-10-CM | POA: Diagnosis not present

## 2020-09-20 ENCOUNTER — Encounter: Payer: Self-pay | Admitting: Orthopedic Surgery

## 2020-09-20 NOTE — Progress Notes (Signed)
Office Visit Note   Patient: Linda [name]           Date of Birth: 22-Aug-1986           MRN: [id number] Visit Date: 09/19/2020 Requested by: No referring provider defined for this encounter. PCP: System, Provider Not In  Subjective: Chief Complaint  Patient presents with  . Right Shoulder - Pain    HPI: Linda [name] is a 35 year old patient with right shoulder and arm pain.  She had an injury 2 years ago but never had it checked.  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  She reports some achiness in the shoulder and difficulty sleeping on that right-hand side.  The pain is constant but it comes and goes in severity.  She does report radiating pain down the arm with occasional numbness and tingling in digits three and four.  She cannot brush her hair.  Denies any neck pain.  Denies any scapular pain.  Her symptoms are getting worse.  She has tried a home exercise program of stretching.  She is also tried activity modification.  She is right-hand dominant.  Outside radiographs reviewed are unremarkable for any bony pathology.              ROS: All systems reviewed are negative as they relate to the chief complaint within the history of present illness.  Patient denies  fevers or chills.   Assessment & Plan: Visit Diagnoses:  1. Right shoulder pain, unspecified chronicity     Plan: Impression is right shoulder pain with normal radiographs but fairly significant weakness to infraspinatus testing.  I think is possible she does have a rotator cuff tear or spinoglenoid notch cyst giving some weakness.  Symptoms have been ongoing for 2 years worsening and there has been failure of conservative management.  She has tried a steroid Dosepak as well which did not give her relief.  Plan MRI arthrogram right shoulder to evaluate for infraspinatus/supraspinatus rotator cuff tear versus spinoglenoid notch cyst.  Follow-up after that  study.  Follow-Up Instructions: Return for after MRI.   Orders:  Orders Placed This Encounter  Procedures  . MR SHOULDER RIGHT W CONTRAST  . Arthrogram   No orders of the defined types were placed in this encounter.     Procedures: No procedures performed   Clinical Data: No additional findings.  Objective: Vital Signs: LMP 09/13/2020   Physical Exam:   Constitutional: Patient appears well-developed HEENT:  Head: Normocephalic Eyes:EOM are normal Neck: Normal range of motion Cardiovascular: Normal rate Pulmonary/chest: Effort normal Neurologic: Patient is alert Skin: Skin is warm Psychiatric: Patient has normal mood and affect    Ortho Exam: Ortho exam demonstrates good cervical spine range of motion.  5 out of 5 grip EPL FPL interosseous wrist flexion wrist extension.  Radial pulse intact.  Patient has weakness to infraspinatus testing on the right compared to the left at 4- out of 5.  Forward flexion strength is intact.  She has a little bit of coarseness with passive range of motion above 90 degrees on the right-hand side.  No discrete AC joint tenderness.  No restriction of passive range of motion with external rotation bilaterally to about 65 degrees.  O'Brien's testing equivocal on the right negative on the left  Specialty Comments:  No specialty comments available.  Imaging: No results found.   PMFS History: There are no problems to  display for this patient.  History reviewed. No pertinent past medical history.  Family History  Problem Relation Age of Onset  . Aneurysm Father     History reviewed. No pertinent surgical history. Social History   Occupational History  . Not on file  Tobacco Use  . Smoking status: Never Smoker  . Smokeless tobacco: Never Used  Vaping Use  . Vaping Use: Never used  Substance and Sexual Activity  . Alcohol use: Yes  . Drug use: No  . Sexual activity: Yes    Birth control/protection: None

## 2020-10-13 ENCOUNTER — Telehealth: Payer: Self-pay | Admitting: Orthopedic Surgery

## 2020-10-13 NOTE — Telephone Encounter (Signed)
Called pt 1X left vm to call and set appt after 10/16/19 for MRI results with Dr. August Saucer. Will try again later.

## 2020-10-14 ENCOUNTER — Telehealth: Payer: Self-pay | Admitting: Orthopedic Surgery

## 2020-10-14 NOTE — Telephone Encounter (Signed)
Called pt 2X and left vm to call and set MRI review appt with Dr. August Saucer after 10/15/20. Will try again later

## 2020-10-15 ENCOUNTER — Ambulatory Visit
Admission: RE | Admit: 2020-10-15 | Discharge: 2020-10-15 | Disposition: A | Discharge status: Home / Self Care | Payer: Medicaid Other | Source: Ambulatory Visit | Attending: Orthopedic Surgery | Admitting: Orthopedic Surgery

## 2020-10-15 DIAGNOSIS — M25511 Pain in right shoulder: Secondary | ICD-10-CM

## 2020-10-15 MED ORDER — IOPAMIDOL (ISOVUE-M 200) INJECTION 41%
13.0000 mL | Freq: Once | INTRAMUSCULAR | Status: AC
  Administered 2020-10-15: 13 mL via INTRA_ARTICULAR

## 2020-10-20 ENCOUNTER — Telehealth: Payer: Self-pay | Admitting: Orthopedic Surgery

## 2020-10-20 NOTE — Telephone Encounter (Signed)
Called and left 3rd message for pt to call and set MRI review appt. Will delete off task board for attempted calls. Please set appt if pt calls

## 2020-10-29 ENCOUNTER — Ambulatory Visit (INDEPENDENT_AMBULATORY_CARE_PROVIDER_SITE_OTHER): Payer: Medicaid Other | Admitting: Orthopedic Surgery

## 2020-10-29 ENCOUNTER — Encounter: Payer: Self-pay | Admitting: Orthopedic Surgery

## 2020-10-29 DIAGNOSIS — S46811A Strain of other muscles, fascia and tendons at shoulder and upper arm level, right arm, initial encounter: Secondary | ICD-10-CM

## 2020-10-29 NOTE — Progress Notes (Signed)
Office Visit Note   Patient: Linda [name]           Date of Birth: 09-14-1985           MRN: [id number] Visit Date: 10/29/2020 Requested by: No referring provider defined for this encounter. PCP: System, Provider Not In  Subjective: Chief Complaint  Patient presents with  . Other     Scan review    HPI: Linda [name] is a 35 y.o. female who presents to the office complaining of right shoulder pain.  Patient returns to discuss MRI of the right shoulder.  MRI revealed significant thinning and partial-thickness articular surface tearing of the supraspinatus and infraspinatus tendons without any definitive full-thickness tear.  No bicep tendon pathology, labral pathology, chondral defects.  Patient complains of no significant change since last visit.  She continues to wake with right shoulder pain.  She states that the steroid Dosepak provided no relief.  Localizes most of her pain to the lateral aspect of the right shoulder with occasional radiation down the arm but nothing consistent.  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  She has a 20-year-old and 34 year old.  She has difficulty lifting objects because her "arm gives out".  No history of right shoulder surgery.  She is right-handed..                ROS: All systems reviewed are negative as they relate to the chief complaint within the history of present illness.  Patient denies fevers or chills.  Assessment & Plan: Visit Diagnoses:  1. Infraspinatus tendon tear, right, initial encounter     Plan: Patient is a 35 year old female presents for evaluation of right shoulder pain.  She is here to review MRI of the right shoulder.  MRI results are as detailed above in HPI.  Discussed options available to patient.  She does have infraspinatus weakness on exam today.  She is waking with pain and this is causing her significant distress and disability in her daily life, particularly with lifting objects.  She had no relief from steroid Dosepak and she  would like to proceed with surgical management.  Plan to post patient for right shoulder rotator cuff repair with right shoulder arthroscopy.  We discussed with her at length the operative plan which would involve reattachment of the nearly completely torn infraspinatus and supraspinatus tendon.  This attachment will not be as strong as her normal attachment but in our experience it has served patients well for activities of daily living.  At this time she has really had symptoms for a long time and has failed conservative management.  Patient understands risk and benefits as well as the extensive rehabilitative effort required on her part.  All questions answered.  Plan to follow-up after procedure.  Follow-Up Instructions: No follow-ups on file.   Orders:  No orders of the defined types were placed in this encounter.  No orders of the defined types were placed in this encounter.     Procedures: No procedures performed   Clinical Data: No additional findings.  Objective: Vital Signs: There were no vitals taken for this visit.  Physical Exam:  Constitutional: Patient appears well-developed HEENT:  Head: Normocephalic Eyes:EOM are normal Neck: Normal range of motion Cardiovascular: Normal rate Pulmonary/chest: Effort normal Neurologic: Patient is alert Skin: Skin is warm Psychiatric: Patient has normal mood and affect  Ortho Exam: Ortho exam demonstrates right shoulder with 60 degrees external rotation, 90 degrees abduction, 115 degrees forward flexion.  5/5  motor strength of supraspinatus and subscapularis with 4/5 motor strength of infraspinatus.  No tenderness over the [facility] joint.  No tenderness over the bicipital groove.  Specialty Comments:  No specialty comments available.  Imaging: No results found.   PMFS History: There are no problems to display for this patient.  No past medical history on file.  Family History  Problem Relation Age of Onset  . Aneurysm Father      No past surgical history on file. Social History   Occupational History  . Not on file  Tobacco Use  . Smoking status: Never Smoker  . Smokeless tobacco: Never Used  Vaping Use  . Vaping Use: Never used  Substance and Sexual Activity  . Alcohol use: Yes  . Drug use: No  . Sexual activity: Yes    Birth control/protection: None

## 2020-12-08 ENCOUNTER — Other Ambulatory Visit (HOSPITAL_COMMUNITY)
Admission: RE | Admit: 2020-12-08 | Discharge: 2020-12-08 | Disposition: A | Discharge status: Home / Self Care | Payer: Medicaid Other | Source: Ambulatory Visit | Attending: Orthopedic Surgery | Admitting: Orthopedic Surgery

## 2020-12-08 ENCOUNTER — Other Ambulatory Visit: Payer: Self-pay

## 2020-12-08 DIAGNOSIS — Z01812 Encounter for preprocedural laboratory examination: Secondary | ICD-10-CM | POA: Diagnosis not present

## 2020-12-08 DIAGNOSIS — Z20822 Contact with and (suspected) exposure to covid-19: Secondary | ICD-10-CM | POA: Diagnosis not present

## 2020-12-08 LAB — SARS CORONAVIRUS 2 (TAT 6-24 HRS): SARS Coronavirus 2: NEGATIVE

## 2020-12-10 ENCOUNTER — Other Ambulatory Visit: Payer: Self-pay

## 2020-12-10 ENCOUNTER — Encounter (HOSPITAL_COMMUNITY): Payer: Self-pay | Admitting: Orthopedic Surgery

## 2020-12-10 NOTE — Progress Notes (Signed)
PCP -  Cardiologist -   Chest x-ray -  EKG -  Stress Test -  ECHO -  Cardiac Cath -   Blood Thinner Instructions:  Aspirin Instructions:   ERAS Protcol - yes clears until 1230  COVID TEST- 12/08/20 negative   Anesthesia review: n/a  -------------  SDW INSTRUCTIONS:  Your procedure is scheduled on 12/11/20. Please report to [facility] Main Entrance "A" at 1300 P.M., and check in at the Admitting office. Call this number if you have problems the morning of surgery: [phone]   Remember: Do not eat after midnight the night before your surgery  You may drink clear liquids until 1230PM the morning of your surgery.   Clear liquids allowed are: Water, Non-Citrus Juices (without pulp), Carbonated Beverages, Clear Tea, Black Coffee Only, and Gatorade   Medications to take morning of surgery with a sip of water include: none    As of today, STOP taking any Aspirin (unless otherwise instructed by your surgeon), Aleve, Naproxen, Ibuprofen, Motrin, Advil, Goody's, BC's, all herbal medications, fish oil, and all vitamins.    The Morning of Surgery Do not wear jewelry, make-up or nail polish. Do not wear lotions, powders, or perfumes, or deodorant Do not shave 48 hours prior to surgery.   Do not bring valuables to the hospital. [facility] is not responsible for any belongings or valuables. If you are a smoker, DO NOT Smoke 24 hours prior to surgery If you wear a CPAP at night please bring your mask the morning of surgery  Remember that you must have someone to transport you home after your surgery, and remain with you for 24 hours if you are discharged the same day. Please bring cases for contacts, glasses, hearing aids, dentures or bridgework because it cannot be worn into surgery.   Patients discharged the day of surgery will not be allowed to drive home.   Please shower the NIGHT BEFORE SURGERY and the MORNING OF SURGERY with DIAL Soap. Wear comfortable clothes the morning of  surgery. Oral Hygiene is also important to reduce your risk of infection.  Remember - BRUSH YOUR TEETH THE MORNING OF SURGERY WITH YOUR REGULAR TOOTHPASTE  Patient denies shortness of breath, fever, cough and chest pain.

## 2020-12-11 ENCOUNTER — Ambulatory Visit (HOSPITAL_COMMUNITY): Payer: Medicaid Other | Admitting: Anesthesiology

## 2020-12-11 ENCOUNTER — Encounter (HOSPITAL_COMMUNITY)
Admission: RE | Disposition: A | Discharge status: Home / Self Care | Payer: Self-pay | Source: Home / Self Care | Attending: Orthopedic Surgery

## 2020-12-11 ENCOUNTER — Encounter (HOSPITAL_COMMUNITY): Payer: Self-pay | Admitting: Orthopedic Surgery

## 2020-12-11 ENCOUNTER — Other Ambulatory Visit: Payer: Self-pay

## 2020-12-11 ENCOUNTER — Ambulatory Visit (HOSPITAL_COMMUNITY)
Admission: RE | Admit: 2020-12-11 | Discharge: 2020-12-11 | Disposition: A | Discharge status: Home / Self Care | Payer: Medicaid Other | Attending: Orthopedic Surgery | Admitting: Orthopedic Surgery

## 2020-12-11 DIAGNOSIS — M7521 Bicipital tendinitis, right shoulder: Secondary | ICD-10-CM | POA: Diagnosis not present

## 2020-12-11 DIAGNOSIS — Z7982 Long term (current) use of aspirin: Secondary | ICD-10-CM | POA: Diagnosis not present

## 2020-12-11 DIAGNOSIS — M75121 Complete rotator cuff tear or rupture of right shoulder, not specified as traumatic: Secondary | ICD-10-CM | POA: Diagnosis present

## 2020-12-11 DIAGNOSIS — Z79899 Other long term (current) drug therapy: Secondary | ICD-10-CM | POA: Diagnosis not present

## 2020-12-11 HISTORY — PX: SHOULDER ARTHROSCOPY WITH ROTATOR CUFF REPAIR AND SUBACROMIAL DECOMPRESSION: SHX5686

## 2020-12-11 LAB — BASIC METABOLIC PANEL
Anion gap: 7 (ref 5–15)
BUN: 8 mg/dL (ref 6–20)
CO2: 25 mmol/L (ref 22–32)
Calcium: 9.2 mg/dL (ref 8.9–10.3)
Chloride: 105 mmol/L (ref 98–111)
Creatinine, Ser: 0.76 mg/dL (ref 0.44–1.00)
GFR, Estimated: >60 mL/min (ref >60)
Glucose, Bld: 85 mg/dL (ref 70–99)
Potassium: 3.2 mmol/L — ABNORMAL LOW (ref 3.5–5.1)
Sodium: 137 mmol/L (ref 135–145)

## 2020-12-11 LAB — CBC
HCT: 37.8 % (ref 36.0–46.0)
Hemoglobin: 11.7 g/dL — ABNORMAL LOW (ref 12.0–15.0)
MCH: 25.5 pg — ABNORMAL LOW (ref 26.0–34.0)
MCHC: 31 g/dL (ref 30.0–36.0)
MCV: 82.4 fL (ref 80.0–100.0)
Platelets: 346 10*3/uL (ref 150–400)
RBC: 4.59 MIL/uL (ref 3.87–5.11)
RDW: 13.8 % (ref 11.5–15.5)
WBC: 7.7 10*3/uL (ref 4.0–10.5)
nRBC: 0 % (ref 0.0–0.2)

## 2020-12-11 LAB — POCT PREGNANCY, URINE: Preg Test, Ur: NEGATIVE

## 2020-12-11 SURGERY — SHOULDER ARTHROSCOPY WITH ROTATOR CUFF REPAIR AND SUBACROMIAL DECOMPRESSION
Anesthesia: General | Site: Shoulder | Laterality: Right

## 2020-12-11 MED ORDER — MIDAZOLAM HCL 2 MG/2ML IJ SOLN
INTRAMUSCULAR | Status: AC
  Administered 2020-12-11: 2 mg via INTRAVENOUS
  Filled 2020-12-11: qty 2

## 2020-12-11 MED ORDER — SUGAMMADEX SODIUM 200 MG/2ML IV SOLN
INTRAVENOUS | Status: DC | PRN
  Administered 2020-12-11: 100 mg via INTRAVENOUS

## 2020-12-11 MED ORDER — PROPOFOL 10 MG/ML IV BOLUS
INTRAVENOUS | Status: DC | PRN
  Administered 2020-12-11: 130 mg via INTRAVENOUS

## 2020-12-11 MED ORDER — ACETAMINOPHEN 325 MG PO TABS: 325.0000 mg | ORAL_TABLET | ORAL | Status: DC | PRN

## 2020-12-11 MED ORDER — CEFAZOLIN SODIUM-DEXTROSE 2-4 GM/100ML-% IV SOLN
2.0000 g | INTRAVENOUS | Status: AC
  Administered 2020-12-11: 2 g via INTRAVENOUS
  Filled 2020-12-11: qty 100

## 2020-12-11 MED ORDER — KETOROLAC TROMETHAMINE 10 MG PO TABS: 10.0000 mg | ORAL_TABLET | Freq: Three times a day (TID) | ORAL | 0 refills | Status: AC | PRN

## 2020-12-11 MED ORDER — ONDANSETRON HCL 4 MG/2ML IJ SOLN
INTRAMUSCULAR | Status: AC
  Filled 2020-12-11: qty 2

## 2020-12-11 MED ORDER — EPINEPHRINE PF 1 MG/ML IJ SOLN
INTRAMUSCULAR | Status: AC
  Filled 2020-12-11: qty 3

## 2020-12-11 MED ORDER — ROCURONIUM BROMIDE 10 MG/ML (PF) SYRINGE
PREFILLED_SYRINGE | INTRAVENOUS | Status: DC | PRN
  Administered 2020-12-11: 60 mg via INTRAVENOUS

## 2020-12-11 MED ORDER — ACETAMINOPHEN 160 MG/5ML PO SOLN
325.0000 mg | ORAL | Status: DC | PRN
Start: 2020-12-11 — End: 2020-12-11

## 2020-12-11 MED ORDER — GABAPENTIN 300 MG PO CAPS: 300.0000 mg | ORAL_CAPSULE | Freq: Three times a day (TID) | ORAL | 0 refills | Status: AC

## 2020-12-11 MED ORDER — OXYCODONE HCL 5 MG/5ML PO SOLN: 5.0000 mg | Freq: Once | ORAL | Status: DC | PRN

## 2020-12-11 MED ORDER — PHENYLEPHRINE 40 MCG/ML (10ML) SYRINGE FOR IV PUSH (FOR BLOOD PRESSURE SUPPORT)
PREFILLED_SYRINGE | INTRAVENOUS | Status: DC | PRN
  Administered 2020-12-11: 120 ug via INTRAVENOUS

## 2020-12-11 MED ORDER — DEXAMETHASONE SODIUM PHOSPHATE 10 MG/ML IJ SOLN
INTRAMUSCULAR | Status: AC
  Filled 2020-12-11: qty 1

## 2020-12-11 MED ORDER — POVIDONE-IODINE 10 % EX SWAB: 2.0000 "application " | Freq: Once | CUTANEOUS | Status: DC

## 2020-12-11 MED ORDER — MIDAZOLAM HCL 2 MG/2ML IJ SOLN: 2.0000 mg | Freq: Once | INTRAMUSCULAR | Status: AC

## 2020-12-11 MED ORDER — LIDOCAINE 2% (20 MG/ML) 5 ML SYRINGE
INTRAMUSCULAR | Status: DC | PRN
  Administered 2020-12-11: 100 mg via INTRAVENOUS

## 2020-12-11 MED ORDER — BUPIVACAINE HCL (PF) 0.25 % IJ SOLN
INTRAMUSCULAR | Status: AC
  Filled 2020-12-11: qty 30

## 2020-12-11 MED ORDER — OXYCODONE HCL 5 MG PO TABS
5.0000 mg | ORAL_TABLET | Freq: Once | ORAL | Status: DC | PRN
Start: 2020-12-11 — End: 2020-12-11

## 2020-12-11 MED ORDER — FENTANYL CITRATE (PF) 250 MCG/5ML IJ SOLN
INTRAMUSCULAR | Status: AC
  Filled 2020-12-11: qty 5

## 2020-12-11 MED ORDER — METHOCARBAMOL 500 MG PO TABS: 500.0000 mg | ORAL_TABLET | Freq: Three times a day (TID) | ORAL | 0 refills | Status: AC | PRN

## 2020-12-11 MED ORDER — PROPOFOL 10 MG/ML IV BOLUS
INTRAVENOUS | Status: AC
  Filled 2020-12-11: qty 20

## 2020-12-11 MED ORDER — FENTANYL CITRATE (PF) 100 MCG/2ML IJ SOLN: 25.0000 ug | INTRAMUSCULAR | Status: DC | PRN

## 2020-12-11 MED ORDER — POVIDONE-IODINE 7.5 % EX SOLN: Freq: Once | CUTANEOUS | Status: DC

## 2020-12-11 MED ORDER — ONDANSETRON HCL 4 MG/2ML IJ SOLN
INTRAMUSCULAR | Status: DC | PRN
  Administered 2020-12-11: 4 mg via INTRAVENOUS

## 2020-12-11 MED ORDER — PHENYLEPHRINE HCL-NACL 10-0.9 MG/250ML-% IV SOLN
INTRAVENOUS | Status: DC | PRN
  Administered 2020-12-11: 25 ug/min via INTRAVENOUS

## 2020-12-11 MED ORDER — EPINEPHRINE PF 1 MG/ML IJ SOLN
INTRAMUSCULAR | Status: DC | PRN
  Administered 2020-12-11: 1 mg

## 2020-12-11 MED ORDER — CHLORHEXIDINE GLUCONATE 0.12 % MT SOLN: 15.0000 mL | Freq: Once | OROMUCOSAL | Status: AC

## 2020-12-11 MED ORDER — FENTANYL CITRATE (PF) 100 MCG/2ML IJ SOLN
INTRAMUSCULAR | Status: AC
  Administered 2020-12-11: 100 ug via INTRAVENOUS
  Filled 2020-12-11: qty 2

## 2020-12-11 MED ORDER — CHLORHEXIDINE GLUCONATE 0.12 % MT SOLN
OROMUCOSAL | Status: AC
  Administered 2020-12-11: 15 mL via OROMUCOSAL
  Filled 2020-12-11: qty 15

## 2020-12-11 MED ORDER — FENTANYL CITRATE (PF) 100 MCG/2ML IJ SOLN: 100.0000 ug | Freq: Once | INTRAMUSCULAR | Status: AC

## 2020-12-11 MED ORDER — OXYCODONE-ACETAMINOPHEN 5-325 MG PO TABS: 1.0000 | ORAL_TABLET | ORAL | 0 refills | Status: AC | PRN

## 2020-12-11 MED ORDER — ONDANSETRON HCL 4 MG/2ML IJ SOLN: 4.0000 mg | Freq: Once | INTRAMUSCULAR | Status: DC | PRN

## 2020-12-11 MED ORDER — BUPIVACAINE LIPOSOME 1.3 % IJ SUSP
INTRAMUSCULAR | Status: DC | PRN
  Administered 2020-12-11: 10 mL via PERINEURAL

## 2020-12-11 MED ORDER — ORAL CARE MOUTH RINSE: 15.0000 mL | Freq: Once | OROMUCOSAL | Status: AC

## 2020-12-11 MED ORDER — MIDAZOLAM HCL 2 MG/2ML IJ SOLN
INTRAMUSCULAR | Status: AC
  Filled 2020-12-11: qty 2

## 2020-12-11 MED ORDER — BUPIVACAINE-EPINEPHRINE (PF) 0.5% -1:200000 IJ SOLN
INTRAMUSCULAR | Status: DC | PRN
  Administered 2020-12-11: 5 mL via PERINEURAL

## 2020-12-11 MED ORDER — LACTATED RINGERS IV SOLN: INTRAVENOUS | Status: DC

## 2020-12-11 MED ORDER — DEXAMETHASONE SODIUM PHOSPHATE 10 MG/ML IJ SOLN
INTRAMUSCULAR | Status: DC | PRN
  Administered 2020-12-11: 4 mg via INTRAVENOUS

## 2020-12-11 MED ORDER — MEPERIDINE HCL 25 MG/ML IJ SOLN: 6.2500 mg | INTRAMUSCULAR | Status: DC | PRN

## 2020-12-11 SURGICAL SUPPLY — 70 items
ALCOHOL 70% 16 OZ (MISCELLANEOUS) ×2 IMPLANT
ANCHOR FBRTK 2.6 SUTURETAP 1.3 (Anchor) ×4 IMPLANT
ANCHOR SL BIO 4.75 W/FIBERTAPE (Anchor) ×4 IMPLANT
ANCHOR SUT 1.8 FBRTK KNTLS 2SU (Anchor) ×4 IMPLANT
BLADE CUTTER GATOR 3.5 (BLADE) IMPLANT
BLADE EXCALIBUR 4.0X13 (MISCELLANEOUS) IMPLANT
BLADE SURG 11 STRL SS (BLADE) ×2 IMPLANT
BLADE SURG 15 STRL LF DISP TIS (BLADE) ×2 IMPLANT
BLADE SURG 15 STRL SS (BLADE) ×2
BUR OVAL 6.0 (BURR) IMPLANT
COVER SURGICAL LIGHT HANDLE (MISCELLANEOUS) ×2 IMPLANT
COVER WAND RF STERILE (DRAPES) ×2 IMPLANT
DRAPE INCISE IOBAN 66X45 STRL (DRAPES) ×4 IMPLANT
DRAPE STERI 35X30 U-POUCH (DRAPES) ×2 IMPLANT
DRAPE U-SHAPE 47X51 STRL (DRAPES) ×4 IMPLANT
DRSG TEGADERM 4X4.75 (GAUZE/BANDAGES/DRESSINGS) ×6 IMPLANT
DURAPREP 26ML APPLICATOR (WOUND CARE) ×2 IMPLANT
ELECT REM PT RETURN 9FT ADLT (ELECTROSURGICAL) ×2
ELECTRODE REM PT RTRN 9FT ADLT (ELECTROSURGICAL) ×1 IMPLANT
FILTER STRAW FLUID ASPIR (MISCELLANEOUS) ×2 IMPLANT
GAUZE SPONGE 4X4 12PLY STRL (GAUZE/BANDAGES/DRESSINGS) ×2 IMPLANT
GAUZE XEROFORM 1X8 LF (GAUZE/BANDAGES/DRESSINGS) ×2 IMPLANT
GLOVE ECLIPSE 7.0 STRL STRAW (GLOVE) ×2 IMPLANT
GLOVE ECLIPSE 8.0 STRL XLNG CF (GLOVE) ×2 IMPLANT
GLOVE SRG 8 PF TXTR STRL LF DI (GLOVE) ×1 IMPLANT
GLOVE SURG UNDER POLY LF SZ7 (GLOVE) ×2 IMPLANT
GLOVE SURG UNDER POLY LF SZ8 (GLOVE) ×1
GOWN STRL REUS W/ TWL LRG LVL3 (GOWN DISPOSABLE) ×3 IMPLANT
GOWN STRL REUS W/TWL LRG LVL3 (GOWN DISPOSABLE) ×3
HYDROGEN PEROXIDE 16OZ (MISCELLANEOUS) ×2 IMPLANT
KIT BASIN OR (CUSTOM PROCEDURE TRAY) ×2 IMPLANT
KIT STR SPEAR 1.8 FBRTK DISP (KITS) ×2 IMPLANT
KIT TURNOVER KIT B (KITS) ×2 IMPLANT
MANIFOLD NEPTUNE II (INSTRUMENTS) ×2 IMPLANT
NDL SUT 6 .5 CRC .975X.05 MAYO (NEEDLE) ×1 IMPLANT
NEEDLE HYPO 25X1 1.5 SAFETY (NEEDLE) ×2 IMPLANT
NEEDLE MAYO TAPER (NEEDLE) ×1
NEEDLE SCORPION MULTI FIRE (NEEDLE) ×2 IMPLANT
NEEDLE SPNL 18GX3.5 QUINCKE PK (NEEDLE) ×2 IMPLANT
NS IRRIG 1000ML POUR BTL (IV SOLUTION) ×2 IMPLANT
PACK SHOULDER (CUSTOM PROCEDURE TRAY) ×2 IMPLANT
PAD ARMBOARD 7.5X6 YLW CONV (MISCELLANEOUS) ×4 IMPLANT
PORT APPOLLO RF 90DEGREE MULTI (SURGICAL WAND) IMPLANT
RESTRAINT HEAD UNIVERSAL NS (MISCELLANEOUS) ×2 IMPLANT
SLING ARM IMMOBILIZER LRG (SOFTGOODS) ×2 IMPLANT
SOL PREP POV-IOD 4OZ 10% (MISCELLANEOUS) ×4 IMPLANT
SPONGE LAP 4X18 RFD (DISPOSABLE) ×6 IMPLANT
STRIP CLOSURE SKIN 1/2X4 (GAUZE/BANDAGES/DRESSINGS) ×2 IMPLANT
SUCTION FRAZIER HANDLE 10FR (MISCELLANEOUS) ×1
SUCTION TUBE FRAZIER 10FR DISP (MISCELLANEOUS) ×1 IMPLANT
SUT 2 FIBERLOOP 20 STRT BLUE (SUTURE)
SUT ETHILON 3 0 PS 1 (SUTURE) ×4 IMPLANT
SUT FIBERWIRE #2 38 T-5 BLUE (SUTURE)
SUT MNCRL AB 3-0 PS2 18 (SUTURE) ×2 IMPLANT
SUT VIC AB 0 CT1 27 (SUTURE) ×2
SUT VIC AB 0 CT1 27XBRD ANBCTR (SUTURE) ×2 IMPLANT
SUT VIC AB 1 CT1 27 (SUTURE) ×2
SUT VIC AB 1 CT1 27XBRD ANBCTR (SUTURE) ×2 IMPLANT
SUT VIC AB 2-0 CT1 27 (SUTURE) ×1
SUT VIC AB 2-0 CT1 TAPERPNT 27 (SUTURE) ×1 IMPLANT
SUT VICRYL 0 UR6 27IN ABS (SUTURE) ×20 IMPLANT
SUTURE 2 FIBERLOOP 20 STRT BLU (SUTURE) IMPLANT
SUTURE FIBERWR #2 38 T-5 BLUE (SUTURE) IMPLANT
SYR 20ML LL LF (SYRINGE) ×4 IMPLANT
SYR 30ML LL (SYRINGE) ×2 IMPLANT
SYR TB 1ML LUER SLIP (SYRINGE) ×2 IMPLANT
TOWEL GREEN STERILE (TOWEL DISPOSABLE) ×2 IMPLANT
TOWEL GREEN STERILE FF (TOWEL DISPOSABLE) ×2 IMPLANT
TUBING ARTHROSCOPY IRRIG 16FT (MISCELLANEOUS) ×2 IMPLANT
WATER STERILE IRR 1000ML POUR (IV SOLUTION) ×2 IMPLANT

## 2020-12-11 NOTE — H&P (Signed)
Linda [name] is an 35 y.o. female.   Chief Complaint: right shoulder pain [name] is a 35 y.o. female who presents to the office complaining of right shoulder pain.  Patient returns to discuss MRI of the right shoulder.  MRI revealed significant thinning and partial-thickness articular surface tearing of the supraspinatus and infraspinatus tendons without any definitive full-thickness tear.  No bicep tendon pathology, labral pathology, chondral defects.  Patient complains of no significant change since last visit.  She continues to wake with right shoulder pain.  She states that the steroid Dosepak provided no relief.  Localizes most of her pain to the lateral aspect of the right shoulder with occasional radiation down the arm but nothing consistent.  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  She has a 65-year-old and 34 year old.  She has difficulty lifting objects because her "arm gives out".  No history of right shoulder surgery.  She is right-handed.Marland Kitchen     History reviewed. No pertinent past medical history.  History reviewed. No pertinent surgical history.  Family History  Problem Relation Age of Onset  . Aneurysm Father    Social History:  reports that she has never smoked. She has never used smokeless tobacco. She reports current alcohol use. She reports that she does not use drugs.  Allergies: No Known Allergies  Medications Prior to Admission  Medication Sig Dispense Refill  . acetaminophen (TYLENOL) 500 MG tablet Take 1,000 mg by mouth every 6 (six) hours as needed for headache. (Patient not taking: No sig reported)    . aspirin-acetaminophen-caffeine (EXCEDRIN MIGRAINE) 250-250-65 MG per tablet Take 2 tablets by mouth every 6 (six) hours as needed for headache. (Patient not taking: No sig reported)    . Aspirin-Acetaminophen-Caffeine (GOODY HEADACHE PO) Take 1 packet by mouth daily as needed (headache). (Patient not taking: No sig reported)    . cephALEXin (KEFLEX) 500 MG capsule  Take 1 capsule (500 mg total) by mouth 4 (four) times daily. (Patient not taking: No sig reported) 28 capsule 0  . cetirizine-pseudoephedrine (ZYRTEC-D) 5-120 MG tablet Take 1 tablet by mouth daily. (Patient not taking: No sig reported) 30 tablet 0  . naproxen (NAPROSYN) 500 MG tablet Take 1 tablet (500 mg total) by mouth 2 (two) times daily. (Patient not taking: No sig reported) 30 tablet 0  . predniSONE (STERAPRED UNI-PAK 21 TAB) 10 MG (21) TBPK tablet Take by mouth daily. Take 6 tabs by mouth daily  for 2 days, then 5 tabs for 2 days, then 4 tabs for 2 days, then 3 tabs for 2 days, 2 tabs for 2 days, then 1 tab by mouth daily for 2 days (Patient not taking: No sig reported) 42 tablet 0    Results for orders placed or performed during the hospital encounter of 12/11/20 (from the past 48 hour(s))  Basic metabolic panel     Status: Abnormal   Collection Time: 12/11/20  1:25 PM  Result Value Ref Range   Sodium 137 135 - 145 mmol/L   Potassium 3.2 (L) 3.5 - 5.1 mmol/L   Chloride 105 98 - 111 mmol/L   CO2 25 22 - 32 mmol/L   Glucose, Bld 85 70 - 99 mg/dL    Comment: Glucose reference range applies only to samples taken after fasting for at least 8 hours.   BUN 8 6 - 20 mg/dL   Creatinine, Ser 6.37 0.44 - 1.00 mg/dL   Calcium 9.2 8.9 - 85.8 mg/dL   GFR, Estimated >85 >02  mL/min    Comment: (NOTE) Calculated using the CKD-EPI Creatinine Equation (2021)    Anion gap 7 5 - 15    Comment: Performed at [facility], [address] [place], [place] [address]  CBC     Status: Abnormal   Collection Time: 12/11/20  1:25 PM  Result Value Ref Range   WBC 7.7 4.0 - 10.5 K/uL   RBC 4.59 3.87 - 5.11 MIL/uL   Hemoglobin 11.7 (L) 12.0 - 15.0 g/dL   HCT 40.1 02.7 - 25.3 %   MCV 82.4 80.0 - 100.0 fL   MCH 25.5 (L) 26.0 - 34.0 pg   MCHC 31.0 30.0 - 36.0 g/dL   RDW 66.4 40.3 - 47.4 %   Platelets 346 150 - 400 K/uL   nRBC 0.0 0.0 - 0.2 %    Comment: Performed at [facility], [address] [place], [place] [address]  Pregnancy, urine POC     Status: None   Collection Time: 12/11/20  1:34 PM  Result Value Ref Range   Preg Test, Ur NEGATIVE NEGATIVE    Comment:        THE SENSITIVITY OF THIS METHODOLOGY IS >24 mIU/mL    No results found.  Review of Systems  Musculoskeletal: Positive for arthralgias.  All other systems reviewed and are negative.   Blood pressure (!) 130/99, pulse 90, temperature 98.1 F (36.7 C), temperature source Oral, resp. rate (!) 24, height 5\' 4"  (1.626 m), weight 73.9 kg, last menstrual period 12/05/2020, SpO2 100 %. Physical Exam Vitals reviewed.  HENT:     Head: Normocephalic.     Right Ear: Tympanic membrane normal.     Nose: Nose normal.     Mouth/Throat:     Mouth: Mucous membranes are moist.  Eyes:     Pupils: Pupils are equal, round, and reactive to light.  Cardiovascular:     Rate and Rhythm: Normal rate.     Pulses: Normal pulses.  Pulmonary:     Effort: Pulmonary effort is normal.  Abdominal:     General: Abdomen is flat.  Musculoskeletal:     Cervical back: Normal range of motion.  Skin:    General: Skin is warm.     Capillary Refill: Capillary refill takes less than 2 seconds.  Neurological:     General: No focal deficit present.     Mental Status: She is alert.  Psychiatric:        Mood and Affect: Mood normal.     Ortho exam demonstrates right shoulder with 60 degrees external rotation, 90 degrees abduction, 115 degrees forward flexion.  5/5 motor strength of supraspinatus and subscapularis with 4/5 motor strength of infraspinatus.  No tenderness over the [facility] joint.  No tenderness over the bicipital groove.         MRI results are as detailed above in HPI.  Discussed options available to patient.  She does have infraspinatus weakness on exam today.  She is waking with pain and this is causing her significant distress and disability in her daily life, particularly with lifting objects.  She had no relief from  steroid Dosepak and she would like to proceed with surgical management.  Plan to post patient for right shoulder rotator cuff repair with right shoulder arthroscopy.  We discussed with her at length the operative plan which would involve reattachment of the nearly completely torn infraspinatus and supraspinatus tendon.  This attachment will not be as strong as her  normal attachment but in our experience it has served patients well for activities of daily living.  At this time she has really had symptoms for a long time and has failed conservative management.  Patient understands risk and benefits as well as the extensive rehabilitative effort required on her part.  All questions answered.  Plan to follow-up after procedure.   Assessment/Plan See above  Burnard Bunting, MD 12/11/2020, 2:55 PM

## 2020-12-11 NOTE — Anesthesia Procedure Notes (Signed)
Anesthesia Regional Block: Interscalene brachial plexus block   Pre-Anesthetic Checklist: ,, timeout performed, Correct Patient, Correct Site, Correct Laterality, Correct Procedure, Correct Position, site marked, Risks and benefits discussed,  Surgical consent,  Pre-op evaluation,  At surgeon's request and post-op pain management  Laterality: Right  Prep: chloraprep       Needles:  Injection technique: Single-shot  Needle Type: Echogenic Stimulator Needle     Needle Length: 5cm  Needle Gauge: 22     Additional Needles:   Procedures:, nerve stimulator,,, ultrasound used (permanent image in chart),,,,   Nerve Stimulator or Paresthesia:  Response: quadraceps contraction, 0.45 mA,   Additional Responses:   Narrative:  Start time: 12/11/2020 2:00 PM End time: 12/11/2020 2:07 PM Injection made incrementally with aspirations every 5 mL.  Performed by: Personally  Anesthesiologist: Bethena Midget, MD  Additional Notes: Functioning IV was confirmed and monitors were applied.  A 58mm 22ga Arrow echogenic stimulator needle was used. Sterile prep and drape,hand hygiene and sterile gloves were used. Ultrasound guidance: relevant anatomy identified, needle position confirmed, local anesthetic spread visualized around nerve(s)., vascular puncture avoided.  Image printed for medical record. Negative aspiration and negative test dose prior to incremental administration of local anesthetic. The patient tolerated the procedure well.

## 2020-12-11 NOTE — Anesthesia Preprocedure Evaluation (Signed)
Anesthesia Evaluation  Patient identified by MRN, date of birth, ID band Patient awake    Reviewed: Allergy & Precautions, H&P , NPO status , Patient's Chart, lab work & pertinent test results, reviewed documented beta blocker date and time   Airway Mallampati: II  TM Distance: >3 FB Neck ROM: full    Dental no notable dental hx. (+) Teeth Intact, Dental Advisory Given   Pulmonary neg pulmonary ROS,    Pulmonary exam normal breath sounds clear to auscultation       Cardiovascular Exercise Tolerance: Good negative cardio ROS   Rhythm:regular Rate:Normal     Neuro/Psych negative neurological ROS  negative psych ROS   GI/Hepatic negative GI ROS, Neg liver ROS,   Endo/Other  negative endocrine ROS  Renal/GU negative Renal ROS  negative genitourinary   Musculoskeletal   Abdominal   Peds  Hematology negative hematology ROS (+)   Anesthesia Other Findings   Reproductive/Obstetrics negative OB ROS                             Anesthesia Physical Anesthesia Plan  ASA: II  Anesthesia Plan: General   Post-op Pain Management: GA combined w/ Regional for post-op pain   Induction: Intravenous  PONV Risk Score and Plan: 3 and Midazolam, Ondansetron and Dexamethasone  Airway Management Planned: LMA and Oral ETT  Additional Equipment: None  Intra-op Plan:   Post-operative Plan: Extubation in OR  Informed Consent: I have reviewed the patients History and Physical, chart, labs and discussed the procedure including the risks, benefits and alternatives for the proposed anesthesia with the patient or authorized representative who has indicated his/her understanding and acceptance.     Dental Advisory Given  Plan Discussed with: CRNA and Anesthesiologist  Anesthesia Plan Comments: (Discussed both nerve block for pain relief post-op and GA; including NV, sore throat, dental injury, and  pulmonary complications)        Anesthesia Quick Evaluation

## 2020-12-11 NOTE — Anesthesia Postprocedure Evaluation (Signed)
Anesthesia Post Note  Patient: Linda [name]  Procedure(s) Performed: right shoulder arthroscopy, debridement, biceps tenodesis, mini open rotator cuff tear repair (Right Shoulder)     Patient location during evaluation: PACU Anesthesia Type: General Level of consciousness: awake and alert Pain management: pain level controlled Vital Signs Assessment: post-procedure vital signs reviewed and stable Respiratory status: spontaneous breathing, nonlabored ventilation, respiratory function stable and patient connected to nasal cannula oxygen Cardiovascular status: blood pressure returned to baseline and stable Postop Assessment: no apparent nausea or vomiting Anesthetic complications: no   No complications documented.  Last Vitals:  Vitals:   12/11/20 1800 12/11/20 1815  BP: (!) 107/51 (!) 112/55  Pulse: 97 94  Resp: (!) 6 12  Temp:  (!) 36.4 C  SpO2: 98% 99%    Last Pain:  Vitals:   12/11/20 1815  TempSrc:   PainSc: 0-No pain                 Derriana Oser COKER

## 2020-12-11 NOTE — Brief Op Note (Signed)
   12/11/2020  5:03 PM  PATIENT:  Linda [name]  35 y.o. female  PRE-OPERATIVE DIAGNOSIS:  right shoulder rotator cuff tear, biceps tendonitis  POST-OPERATIVE DIAGNOSIS:  right shoulder rotator cuff tear, biceps tendonitis  PROCEDURE:  Procedure(s): right shoulder arthroscopy, debridement, biceps tenodesis, mini open rotator cuff tear repair  SURGEON:  Surgeon(s): August Saucer Corrie Mckusick, MD  ASSISTANT: magnant pa  ANESTHESIA:   general  EBL: 15 ml    No intake/output data recorded.  BLOOD ADMINISTERED: none  DRAINS: none   LOCAL MEDICATIONS USED:  none  SPECIMEN:  No Specimen  COUNTS:  YES  TOURNIQUET:  * No tourniquets in log *  DICTATION: .Other Dictation: Dictation Number [id number]  PLAN OF CARE: Discharge to home after PACU  PATIENT DISPOSITION:  PACU - hemodynamically stable

## 2020-12-11 NOTE — Anesthesia Procedure Notes (Signed)
Procedure Name: Intubation Date/Time: 12/11/2020 3:35 PM Performed by: Moshe Salisbury, CRNA Pre-anesthesia Checklist: Patient identified, Emergency Drugs available, Suction available and Patient being monitored Patient Re-evaluated:Patient Re-evaluated prior to induction Oxygen Delivery Method: Circle System Utilized Preoxygenation: Pre-oxygenation with 100% oxygen Induction Type: IV induction Ventilation: Mask ventilation without difficulty Laryngoscope Size: Mac and 3 Grade View: Grade II Tube type: Oral Number of attempts: 1 Airway Equipment and Method: Stylet Placement Confirmation: ETT inserted through vocal cords under direct vision,  positive ETCO2 and breath sounds checked- equal and bilateral Secured at: 23 cm Tube secured with: Tape Dental Injury: Teeth and Oropharynx as per pre-operative assessment

## 2020-12-11 NOTE — Transfer of Care (Signed)
Immediate Anesthesia Transfer of Care Note  Patient: Linda [name]  Procedure(s) Performed: right shoulder arthroscopy, debridement, biceps tenodesis, mini open rotator cuff tear repair (Right Shoulder)  Patient Location: PACU  Anesthesia Type:GA combined with regional for post-op pain  Level of Consciousness: drowsy and patient cooperative  Airway & Oxygen Therapy: Patient Spontanous Breathing and Patient connected to nasal cannula oxygen  Post-op Assessment: Report given to RN, Post -op Vital signs reviewed and stable and Patient moving all extremities  Post vital signs: Reviewed and stable  Last Vitals:  Vitals Value Taken Time  BP 120/72 12/11/20 1745  Temp    Pulse 97 12/11/20 1746  Resp 11 12/11/20 1746  SpO2 100 % 12/11/20 1746  Vitals shown include unvalidated device data.  Last Pain:  Vitals:   12/11/20 1410  TempSrc:   PainSc: 0-No pain      Patients Stated Pain Goal: 2 (12/11/20 1342)  Complications: No complications documented.

## 2020-12-12 ENCOUNTER — Encounter (HOSPITAL_COMMUNITY): Payer: Self-pay | Admitting: Orthopedic Surgery

## 2020-12-12 NOTE — Op Note (Signed)
Linda [name], [name] MEDICAL RECORD NO: [id number] ACCOUNT NO: [id number] DATE OF BIRTH: 07-14-1986 FACILITY: [facility] LOCATION: [facility] PHYSICIAN: Graylin Shiver. August Saucer, MD  Operative Report   PREOPERATIVE DIAGNOSES:  Right shoulder near full thickness rotator cuff tear and biceps tendinitis.  POSTOPERATIVE DIAGNOSES:  Right shoulder near full thickness rotator cuff tear and biceps tendinitis.  PROCEDURES PERFORMED:  Right shoulder arthroscopy with debridement of the superior labrum and rotator interval with release of the biceps tendon and mini open rotator cuff tear repair of a near full thickness supraspinatus tear extending into the  infraspinatus.  SURGEON:  Graylin Shiver. August Saucer, MD  ASSISTANT:  Karenann Cai, PA  INDICATIONS:  The patient is a 35 year old patient with right shoulder pain with failure of nonoperative management, who presents for operative management after explanation of risks and benefits.  DESCRIPTION OF PROCEDURE:  The patient was brought to the operating room where general endotracheal anesthesia was induced.  Preoperative antibiotics were administered.  Timeout was called.  The patient was placed in the beach chair position with head in  neutral position.  Right shoulder was prescrubbed with alcohol and Betadine, allowed the air to dry.  Prepped with DuraPrep solution and draped in a sterile manner.  Ioban used to cover the operative field including the axilla.  Timeout was called.   Posterior portal created 2 cm medial and inferior to the posterolateral margin of the acromion.  Diagnostic arthroscopy was performed.  An anterior portal created under direct visualization.  The patient had intact glenohumeral articular surfaces, intact  anterior inferior, posterior inferior glenohumeral ligaments as well as intact capsular attachment to the humerus inferiorly.  There was significant biceps tendinitis as well as rotator interval synovitis.  This was debrided.  Biceps tendon was   released.  The patient had a near full thickness supraspinatus tear over the length of its attachment, which was about 2 cm.  This extended into the infraspinatus.  It involved approximately 90% of the footprint area of the rotator cuff tendon.   Following debridement and biceps tendon release, instruments were removed.  Portals were closed using 3-0 nylon.  Next, incision made off the anterolateral margin of the acromion.  Deltoid split measured distance of 4 cm from the anterolateral margin of  the acromion.  Self-retaining retractor was placed.  Bursectomy was performed.  Biceps tendon was tenodesed under appropriate tension using 2 knotless SutureTak anchors from [employer] and oversewn with 3-0 Vicryl sutures.  This gave a very secure  tenodesis.  Attention was then directed to the rotator cuff tear.  Essentially, a paper thin layer of supraspinatus was present.  This was incised and taken back to healthy appearing infraspinatus, which was about 2-2.5 cm from the bicipital groove.  The  footprint was debrided and prepared for attachment of the rotator cuff tendon.  Six 0 Vicryl sutures were placed in Mason-Allen fashion along the edge of the rotator cuff tendon.  Two Ar[employer]rex SutureTape anchors were placed, which had 4 suture limbs per  anchor.  These were placed equidistant in the robust portion of the tendon from posterior to anterior.  Then, using traction on the 6 Vicryl sutures, these medial row anchors were tied from posterior to anterior and crossed.  The 4 limbs anteriorly were  taken with the 3 sutures anteriorly in the tendon, 4 limbs posteriorly were taken with the 3 sutures in the posterior aspect of the tendon.  These were placed in [place] and then placed into healthy bone.  This was done  with the arm in abduction.   Watertight repair was achieved.  At this time, thorough irrigation of the shoulder joint was performed.  Instruments were removed.  The deltoid split was closed using #1  Vicryl suture followed by interrupted inverted 0 Vicryl suture, 2-0 Vicryl suture,  and 3-0 Monocryl.  Impervious dressing was placed.  Luke's assistance was required at all times for the case for retraction, opening, closing, mobilization of tissues.  His assistance was a medical necessity.  The patient was placed in a shoulder sling  and will begin rehabilitation tomorrow.   NIK D: 12/11/2020 5:08:58 pm T: 12/12/2020 8:37:00 am  JOB: [id number]/ [id number]

## 2020-12-15 ENCOUNTER — Encounter: Payer: Self-pay | Admitting: Orthopedic Surgery

## 2020-12-19 ENCOUNTER — Ambulatory Visit (INDEPENDENT_AMBULATORY_CARE_PROVIDER_SITE_OTHER): Payer: Medicaid Other | Admitting: Orthopedic Surgery

## 2020-12-19 ENCOUNTER — Encounter: Payer: Self-pay | Admitting: Orthopedic Surgery

## 2020-12-19 DIAGNOSIS — M25511 Pain in right shoulder: Secondary | ICD-10-CM

## 2020-12-19 DIAGNOSIS — S46811A Strain of other muscles, fascia and tendons at shoulder and upper arm level, right arm, initial encounter: Secondary | ICD-10-CM

## 2020-12-19 NOTE — Progress Notes (Signed)
   Post-Op Visit Note   Patient: Linda [name]           Date of Birth: May 15, 1986           MRN: [id number] Visit Date: 12/19/2020 PCP: System, Provider Not In   Assessment & Plan:  Chief Complaint:  Chief Complaint  Patient presents with  . Right Shoulder - Routine Post Op   Visit Diagnoses:  1. Infraspinatus tendon tear, right, initial encounter   2. Right shoulder pain, unspecified chronicity     Plan: Michaelah is a 35 year old patient is about 8 days out right shoulder arthroscopy biceps tenodesis mini open rotator cuff repair.  She is 60 degrees on her brace.  On exam incision is intact.  Sutures removed.  Passive range of motion looks pretty reasonable at this time.  Plan is to start physical therapy here in 2 weeks.  Okay for passive range of motion at that time along with active assisted range of motion 2 times a week for 6 weeks.  Discontinue the sling in 2 weeks but no lifting with that right arm.  3-week return for clinical recheck.  Try to get this 90 degrees on her brace.  Follow-Up Instructions: No follow-ups on file.   Orders:  Orders Placed This Encounter  Procedures  . Ambulatory referral to Physical Therapy   No orders of the defined types were placed in this encounter.   Imaging: No results found.  PMFS History: There are no problems to display for this patient.  No past medical history on file.  Family History  Problem Relation Age of Onset  . Aneurysm Father     Past Surgical History:  Procedure Laterality Date  . SHOULDER ARTHROSCOPY WITH ROTATOR CUFF REPAIR AND SUBACROMIAL DECOMPRESSION Right 12/11/2020   Procedure: right shoulder arthroscopy, debridement, biceps tenodesis, mini open rotator cuff tear repair;  Surgeon: Cammy Copa, MD;  Location: [facility] OR;  Service: Orthopedics;  Laterality: Right;   Social History   Occupational History  . Not on file  Tobacco Use  . Smoking status: Never Smoker  . Smokeless tobacco: Never Used  Vaping  Use  . Vaping Use: Never used  Substance and Sexual Activity  . Alcohol use: Yes  . Drug use: No  . Sexual activity: Yes    Birth control/protection: None

## 2020-12-24 ENCOUNTER — Ambulatory Visit: Payer: BC Managed Care – PPO | Attending: Orthopedic Surgery | Admitting: Physical Therapy

## 2020-12-24 ENCOUNTER — Other Ambulatory Visit: Payer: Self-pay

## 2020-12-24 ENCOUNTER — Encounter: Payer: Self-pay | Admitting: Physical Therapy

## 2020-12-24 DIAGNOSIS — R600 Localized edema: Secondary | ICD-10-CM | POA: Insufficient documentation

## 2020-12-24 DIAGNOSIS — G8929 Other chronic pain: Secondary | ICD-10-CM | POA: Diagnosis present

## 2020-12-24 DIAGNOSIS — Z9889 Other specified postprocedural states: Secondary | ICD-10-CM | POA: Insufficient documentation

## 2020-12-24 DIAGNOSIS — M25511 Pain in right shoulder: Secondary | ICD-10-CM | POA: Diagnosis present

## 2020-12-24 DIAGNOSIS — M6281 Muscle weakness (generalized): Secondary | ICD-10-CM | POA: Diagnosis present

## 2020-12-24 NOTE — Therapy (Signed)
[facility] [address] [place], [place], [address] Phone: [phone]   Fax:  [phone]  Physical Therapy Evaluation  Patient Details  Name: Linda [name] MRN: [id number] Date of Birth: 12/24/1985 Referring Provider (PT): gregory scott dean MD   Encounter Date: 12/24/2020   PT End of Session - 12/24/20 0848    Visit Number 1    Number of Visits 17    Date for PT Re-Evaluation 02/18/21    Authorization Type [employer].    PT Start Time [phone]    PT Stop Time 0915    PT Time Calculation (min) 37 min    Activity Tolerance Patient tolerated treatment well    Behavior During Therapy [facility] for tasks assessed/performed           History reviewed. No pertinent past medical history.  Past Surgical History:  Procedure Laterality Date  . SHOULDER ARTHROSCOPY WITH ROTATOR CUFF REPAIR AND SUBACROMIAL DECOMPRESSION Right 12/11/2020   Procedure: right shoulder arthroscopy, debridement, biceps tenodesis, mini open rotator cuff tear repair;  Surgeon: Cammy Copa, MD;  Location[facility] OR;  Service: Orthopedics;  Laterality: Right;    There were no vitals filed for this visit.    Subjective Assessment - 12/24/20 0844    Subjective pt is a 35y.o F s/p R RCR and bicep tenodesis on 12/11/2020. She reports since the surgery it is not as stiff using a CPM daily. She reports the pain is minimal stays in the shoulder.    Limitations Lifting    How long can you sit comfortably? unlimited    How long can you stand comfortably? unlimited    How long can you walk comfortably? unlimited    Patient Stated Goals get back to normal as much as possible.    Currently in Pain? Yes    Pain Score 2    at worst 5/10   Pain Location Shoulder    Pain Orientation Right    Pain Descriptors / Indicators Aching;Sore    Pain Type Surgical pain    Pain Onset More than a month ago    Pain Frequency Constant    Aggravating Factors  moving the shoulder or  doing something I shoulder    Pain Relieving Factors resting    Effect of Pain on Daily Activities limited shoulder AROM              [facility] PT Assessment - 12/24/20 0001      Assessment   Medical Diagnosis s/p R mini RCR and bicep tenodesis    Referring Provider (PT) gregory scott dean MD    Onset Date/Surgical Date 12/11/20    Hand Dominance Right    Next MD Visit 01/09/2021    Prior Therapy no      Precautions   Precautions Shoulder    Precaution Comments stay in sling for the next 2 weeks. avoid heavy lifting      Restrictions   Weight Bearing Restrictions Yes      Balance Screen   Has the patient fallen in the past 6 months No    Has the patient had a decrease in activity level because of a fear of falling?  No    Is the patient reluctant to leave their home because of a fear of falling?  No      Home Environment   Living Environment Private residence    Living Arrangements Children    Available Help at Discharge Family    Type  of Home Apartment    Home Access Level entry    Home Layout One level    Home Equipment Other (comment)   sling     Prior Function   Level of Independence Independent    Vocation Full time employment   customer services at [employer] Requirements lifting, carrying/ pushing/ pulling      Cognition   Overall Cognitive Status Within Functional Limits for tasks assessed      Observation/Other Assessments   Observations incision site covered with steri-strips but appares to be clean and healing well    Focus on Therapeutic Outcomes (FOTO)  N/A MCD      ROM / Strength   AROM / PROM / Strength AROM;Strength;PROM      AROM   AROM Assessment Site Shoulder    Right/Left Shoulder Right;Left    Left Shoulder Extension 78 Degrees    Left Shoulder Flexion 150 Degrees    Left Shoulder ABduction 148 Degrees    Left Shoulder Internal Rotation --   T8   Left Shoulder External Rotation --   T8     PROM   PROM Assessment Site Shoulder     Right/Left Shoulder Right    Right Shoulder Flexion 45 Degrees    Right Shoulder ABduction 70 Degrees    Right Shoulder Internal Rotation --   at 45 degrees of abduction   Right Shoulder External Rotation 21 Degrees   at 45 degrees of abduction     Strength   Overall Strength Comments unable to test RUE MMT due  precautions    Strength Assessment Site Shoulder    Right/Left Shoulder Right;Left    Left Shoulder Flexion 4+/5    Left Shoulder Extension 4+/5    Left Shoulder ABduction 4+/5    Left Shoulder Internal Rotation 5/5    Left Shoulder External Rotation 5/5      Palpation   Palpation comment TTP along incsion sites and along the anteiror aspect of GHJ                      Objective measurements completed on examination: See above findings.               PT Education - 12/24/20 0909    Education Details evaluation findings, POC, goals, HEP with proper form/ rationale,    Person(s) Educated Patient    Methods Explanation;Verbal cues;Handout    Comprehension Verbalized understanding;Verbal cues required            PT Short Term Goals - 12/24/20 0914      PT SHORT TERM GOAL #1   Title pt to be IND with inital HEP    Baseline no previous HEP    Time 4    Period Weeks    Status New    Target Date 01/21/21      PT SHORT TERM GOAL #2   Title increaes R shoulder AAROM flexion/ abduction to >/= 90 degrees for therapuetic progrssion    Baseline see flow sheet for PROM, unable to do AAROM    Time 4    Period Weeks    Status New    Target Date 01/21/21             PT Long Term Goals - 12/24/20 0915      PT LONG TERM GOAL #1   Title pt to increase R shoulder AROM to [facility] compared bil with </= 2/10 max pain for functional  mobility required for ADLs    Baseline see flow sheet    Time 8    Period Weeks    Status New    Target Date 02/18/21      PT LONG TERM GOAL #2   Title increase Gross R shoulder strength to >/= 4+/5 to maximize  stability and promote lifting mechanics    Baseline unable to assess at eval    Time 8    Period Weeks    Status New    Target Date 02/18/21      PT LONG TERM GOAL #3   Title pt to be able to lift/ lower from >/= 15# floor<>waist and >/= 8# from waist <>overhead with </= 2/10 max pain for functional lifting    Baseline unable to perfrom at eval    Time 8    Period Weeks    Status New    Target Date 02/18/21      PT LONG TERM GOAL #4   Title pt to be IND with all HEP given and is able to maintain and progress current LOF IND    Baseline no previous HEP    Time 8    Period Weeks    Status New    Target Date 02/18/21                  Plan - 12/24/20 0910    Clinical Impression Statement pt is a pleasnt 34y.o F presenting to OPPT s/p R mini openRCR and bicep tenodesis on 12/11/2020. She demonstates limited PROM in the R shoulder secondary to pain and guarding which is expected following surgery. AROM and MMT was assessed due to precautions/ restrictons. TTP located along the anterior aspect of the shoulder along the incision sites. She would benefit from physical therapy to decrease R shoulder pain, improve ROM, increase strength and maixmize function by addressing the deficits listed.    Stability/Clinical Decision Making Stable/Uncomplicated    Clinical Decision Making Low    Rehab Potential Good    PT Frequency 2x / week    PT Duration 8 weeks    PT Treatment/Interventions ADLs/Self Care Home Management;Cryotherapy;Electrical Stimulation;Iontophoresis 4mg /ml Dexamethasone;Moist Heat;Ultrasound;Neuromuscular re-education;Therapeutic exercise;Therapeutic activities;Balance training;Patient/family education;Manual techniques;Passive range of motion;Vasopneumatic Device;Taping    PT Next Visit Plan review/ update HEP PRN, PROM / gentle GHJ mobs, scapular setting, modalities PRN, pt will be 2 weeks post op on 12/25/2020.    PT Home Exercise Plan [id number] - table slides flexoin/  abduction, scapular retraction, upper trap stretch.    Consulted and Agree with Plan of Care Patient           Patient will benefit from skilled therapeutic intervention in order to improve the following deficits and impairments:  Improper body mechanics,Increased muscle spasms,Pain,Decreased activity tolerance,Decreased endurance,Decreased strength,Postural dysfunction,Increased edema,Decreased balance  Visit Diagnosis: S/P right rotator cuff repair  Chronic right shoulder pain  Muscle weakness (generalized)  Localized edema     Problem List There are no problems to display for this patient.  Lulu RidingKristoffer Vina Byrd PT, DPT, LAT, ATC  12/24/20  9:20 AM      [facility] [address] [place], [place], [address] Phone: [phone]   Fax:  [phone]  Name: Thornton ParkSasha Atchley MRN: [id number] Date of Birth: 06/24/1986      Check all possible CPT codes: 97110- Therapeutic Exercise, [phone]- Neuro Re-education, 97140 - Manual Therapy, 97530 - Therapeutic Activities, 97535 - Self Care, [phone] - Electrical stimulation (unattended), [phone] - Iontophoresis,  [address]109 - Ultrasound, 60454 - Vaso and 09811 - Physical performance training

## 2020-12-25 ENCOUNTER — Ambulatory Visit: Payer: BC Managed Care – PPO

## 2020-12-25 DIAGNOSIS — Z9889 Other specified postprocedural states: Secondary | ICD-10-CM | POA: Diagnosis not present

## 2020-12-25 DIAGNOSIS — R600 Localized edema: Secondary | ICD-10-CM

## 2020-12-25 DIAGNOSIS — M6281 Muscle weakness (generalized): Secondary | ICD-10-CM

## 2020-12-25 DIAGNOSIS — G8929 Other chronic pain: Secondary | ICD-10-CM

## 2020-12-25 NOTE — Therapy (Signed)
[facility] Outpatient Rehabilitation [facility] [address] [place], [place], [address] Phone: [phone]   Fax:  [phone]  Physical Therapy Treatment  Patient Details  Name: Linda [name] MRN: [id number] Date of Birth: Nov 11, 1985 Referring Provider (PT): gregory scott dean MD   Encounter Date: 12/25/2020   PT End of Session - 12/25/20 1535    Visit Number 2    Number of Visits 17    Date for PT Re-Evaluation 02/18/21    Authorization Type [employer].    PT Start Time 1535    PT Stop Time 1616    PT Time Calculation (min) 41 min    Activity Tolerance Patient tolerated treatment well;No increased pain    Behavior During Therapy [facility] for tasks assessed/performed           No past medical history on file.  Past Surgical History:  Procedure Laterality Date  . SHOULDER ARTHROSCOPY WITH ROTATOR CUFF REPAIR AND SUBACROMIAL DECOMPRESSION Right 12/11/2020   Procedure: right shoulder arthroscopy, debridement, biceps tenodesis, mini open rotator cuff tear repair;  Surgeon: Cammy Copa, MD;  Location: [facility] OR;  Service: Orthopedics;  Laterality: Right;    There were no vitals filed for this visit.   Subjective Assessment - 12/25/20 1535    Subjective Pt presents to PT with no current reports of pain. She has been compliant with CPM machine but has not yet had a chance to perform HEP to this point. She is ready to being PT treatment at this time.    Currently in Pain? No/denies    Pain Score 0-No pain                             [facility] Adult PT Treatment/Exercise - 12/25/20 0001      Shoulder Exercises: Seated   Other Seated Exercises scapular retractions 2x10 - 3 sec hold      Shoulder Exercises: Stretch   Other Shoulder Stretches AAROM table slide R shoulder flex/abd 2x5 ea - 10 sec hold      Manual Therapy   Manual therapy comments PROM into R shoulder flex/abd and IR/ER in scapular plane                    PT  Short Term Goals - 12/24/20 0914      PT SHORT TERM GOAL #1   Title pt to be IND with inital HEP    Baseline no previous HEP    Time 4    Period Weeks    Status New    Target Date 01/21/21      PT SHORT TERM GOAL #2   Title increaes R shoulder AAROM flexion/ abduction to >/= 90 degrees for therapuetic progrssion    Baseline see flow sheet for PROM, unable to do AAROM    Time 4    Period Weeks    Status New    Target Date 01/21/21             PT Long Term Goals - 12/24/20 0915      PT LONG TERM GOAL #1   Title pt to increase R shoulder AROM to [facility] compared bil with </= 2/10 max pain for functional mobility required for ADLs    Baseline see flow sheet    Time 8    Period Weeks    Status New    Target Date 02/18/21      PT LONG TERM  GOAL #2   Title increase Gross R shoulder strength to >/= 4+/5 to maximize stability and promote lifting mechanics    Baseline unable to assess at eval    Time 8    Period Weeks    Status New    Target Date 02/18/21      PT LONG TERM GOAL #3   Title pt to be able to lift/ lower from >/= 15# floor<>waist and >/= 8# from waist <>overhead with </= 2/10 max pain for functional lifting    Baseline unable to perfrom at eval    Time 8    Period Weeks    Status New    Target Date 02/18/21      PT LONG TERM GOAL #4   Title pt to be IND with all HEP given and is able to maintain and progress current LOF IND    Baseline no previous HEP    Time 8    Period Weeks    Status New    Target Date 02/18/21                 Plan - 12/25/20 1638    Clinical Impression Statement Pt was able to complete prescribed exercises with no increase in pain or adverse effect. She demonstrated improved PROM to R shoulder, with continued limitiation particularly into flex/abd/ER. Overall she is progressing well post surgery and should continue to be seen and progress per protocol.    PT Treatment/Interventions ADLs/Self Care Home  Management;Cryotherapy;Electrical Stimulation;Iontophoresis 4mg /ml Dexamethasone;Moist Heat;Ultrasound;Neuromuscular re-education;Therapeutic exercise;Therapeutic activities;Balance training;Patient/family education;Manual techniques;Passive range of motion;Vasopneumatic Device;Taping    PT Next Visit Plan review/ update HEP PRN, PROM / gentle GHJ mobs, scapular setting, modalities PRN    PT Home Exercise Plan [id number] - table slides flexoin/ abduction, scapular retraction, upper trap stretch.           Patient will benefit from skilled therapeutic intervention in order to improve the following deficits and impairments:  Improper body mechanics,Increased muscle spasms,Pain,Decreased activity tolerance,Decreased endurance,Decreased strength,Postural dysfunction,Increased edema,Decreased balance  Visit Diagnosis: S/P right rotator cuff repair  Chronic right shoulder pain  Muscle weakness (generalized)  Localized edema     Problem List There are no problems to display for this patient.   , PT, DPT 12/25/20 4:40 PM  [facility] Outpatient Rehabilitation [facility] [address] [place], [place], [place] Phone: [phone]   Fax:  [phone]  Name: Linda [name] MRN: [place] Date of Birth: Aug 16, 1986

## 2020-12-28 DIAGNOSIS — M75121 Complete rotator cuff tear or rupture of right shoulder, not specified as traumatic: Secondary | ICD-10-CM

## 2020-12-28 DIAGNOSIS — M7521 Bicipital tendinitis, right shoulder: Secondary | ICD-10-CM

## 2020-12-31 ENCOUNTER — Ambulatory Visit: Payer: BC Managed Care – PPO | Admitting: Physical Therapy

## 2021-01-01 ENCOUNTER — Other Ambulatory Visit: Payer: Self-pay

## 2021-01-01 ENCOUNTER — Encounter: Payer: Self-pay | Admitting: Physical Therapy

## 2021-01-01 ENCOUNTER — Ambulatory Visit: Payer: BC Managed Care – PPO | Admitting: Physical Therapy

## 2021-01-01 DIAGNOSIS — Z9889 Other specified postprocedural states: Secondary | ICD-10-CM | POA: Diagnosis not present

## 2021-01-01 DIAGNOSIS — R600 Localized edema: Secondary | ICD-10-CM

## 2021-01-01 DIAGNOSIS — M25511 Pain in right shoulder: Secondary | ICD-10-CM

## 2021-01-01 DIAGNOSIS — G8929 Other chronic pain: Secondary | ICD-10-CM

## 2021-01-01 DIAGNOSIS — M6281 Muscle weakness (generalized): Secondary | ICD-10-CM

## 2021-01-01 NOTE — Therapy (Signed)
[facility] Outpatient Rehabilitation [facility] [address] [place], [place], [address] Phone: [phone]   Fax:  [phone]  Physical Therapy Treatment  Patient Details  Name: Linda [name] MRN: [id number] Date of Birth: 1986-05-15 Referring Provider (PT): gregory scott dean MD   Encounter Date: 01/01/2021   PT End of Session - 01/01/21 0820    Visit Number 3    Number of Visits 17    Date for PT Re-Evaluation 02/18/21    Authorization Type [employer].    PT Start Time [phone]    PT Stop Time 0855    PT Time Calculation (min) 41 min    Activity Tolerance Patient tolerated treatment well;No increased pain    Behavior During Therapy [facility] for tasks assessed/performed           History reviewed. No pertinent past medical history.  Past Surgical History:  Procedure Laterality Date  . SHOULDER ARTHROSCOPY WITH ROTATOR CUFF REPAIR AND SUBACROMIAL DECOMPRESSION Right 12/11/2020   Procedure: right shoulder arthroscopy, debridement, biceps tenodesis, mini open rotator cuff tear repair;  Surgeon: Cammy Copa, MD;  Location: [facility] OR;  Service: Orthopedics;  Laterality: Right;    There were no vitals filed for this visit.   Subjective Assessment - 01/01/21 0818    Subjective I am weaning our of my sling,  My stitch/ steri strips are bothering me.  I went bowling with my left hand last week and won.  I protected my Right shld    Limitations Lifting    Patient Stated Goals get back to normal as much as possible.    Currently in Pain? Yes    Pain Score 1     Pain Location Shoulder    Pain Orientation Right    Pain Descriptors / Indicators Aching    Pain Type Surgical pain    Pain Onset More than a month ago    Pain Frequency Intermittent              [facility] PT Assessment - 01/01/21 0001      PROM   Right Shoulder Flexion 125 Degrees   no pain   Right Shoulder ABduction 114 Degrees   no pain monitoring throughout session   Right Shoulder External  Rotation 40 Degrees   90 abd                        [facility] Adult PT Treatment/Exercise - 01/01/21 0001      Shoulder Exercises: Seated   Other Seated Exercises scapular retractions 2x10 - 3 sec hold      Shoulder Exercises: Stretch   Other Shoulder Stretches AAROM table slide R shoulder flex/abd sliding towel  holding 5 sec      Modalities   Modalities Cryotherapy      Cryotherapy   Number Minutes Cryotherapy 10 Minutes    Cryotherapy Location Shoulder   right   Type of Cryotherapy Ice pack      Manual Therapy   Manual Therapy Soft tissue mobilization;Joint mobilization    Manual therapy comments PROM into R shoulder flex/abd and IR/ER in scapular plane rhytmic staabilzation protraction/retraction of shld R    Joint Mobilization gentle GHD mobs AP/ inf grade 2,    Soft tissue mobilization R upper trap STW, tack and stretch with upper trap stretch and levator stretch                    PT Short Term Goals -  01/01/21 0848      PT SHORT TERM GOAL #1   Title pt to be IND with inital HEP    Baseline no previous HEP    Time 4    Period Weeks    Status On-going      PT SHORT TERM GOAL #2   Title increaes R shoulder AAROM flexion/ abduction to >/= 90 degrees for therapuetic progrssion    Baseline Abd 114 PROM  flexion 125  on Right PROM    Time 4    Period Weeks    Status Achieved             PT Long Term Goals - 12/24/20 0915      PT LONG TERM GOAL #1   Title pt to increase R shoulder AROM to [facility] compared bil with </= 2/10 max pain for functional mobility required for ADLs    Baseline see flow sheet    Time 8    Period Weeks    Status New    Target Date 02/18/21      PT LONG TERM GOAL #2   Title increase Gross R shoulder strength to >/= 4+/5 to maximize stability and promote lifting mechanics    Baseline unable to assess at eval    Time 8    Period Weeks    Status New    Target Date 02/18/21      PT LONG TERM GOAL #3   Title pt to be  able to lift/ lower from >/= 15# floor<>waist and >/= 8# from waist <>overhead with </= 2/10 max pain for functional lifting    Baseline unable to perfrom at eval    Time 8    Period Weeks    Status New    Target Date 02/18/21      PT LONG TERM GOAL #4   Title pt to be IND with all HEP given and is able to maintain and progress current LOF IND    Baseline no previous HEP    Time 8    Period Weeks    Status New    Target Date 02/18/21                 Plan - 01/01/21 7741    Clinical Impression Statement Pt arrived 13 minutes late to appt but was quickly seen for needed PROM and exercise. Pt reports 1/10 discomfort rot R shoulder but otherwise doing well and weaning from sling.  Jaclynn Guarneri SPTA was guided by PT for PROM exercise and GHD R mobs.  Pt pain level remained 1/10 to 0>5.  STG # 2 achieved  PROM abd 114, and flexion 125. No pain.   Pt will be ready to progress protocol  Next visit.    PT Frequency 2x / week    PT Duration 8 weeks    PT Treatment/Interventions ADLs/Self Care Home Management;Cryotherapy;Electrical Stimulation;Iontophoresis 4mg /ml Dexamethasone;Moist Heat;Ultrasound;Neuromuscular re-education;Therapeutic exercise;Therapeutic activities;Balance training;Patient/family education;Manual techniques;Passive range of motion;Vasopneumatic Device;Taping    PT Next Visit Plan review/ update HEP PRN, PROM / gentle GHJ mobs, scapular setting, modalities PRN vist # 4 pt will be in 4th week, progress HEP protocol    PT Home Exercise Plan [id number] - table slides flexoin/ abduction, scapular retraction, upper trap stretch.    Consulted and Agree with Plan of Care Patient           Patient will benefit from skilled therapeutic intervention in order to improve the following deficits and impairments:  Improper body  mechanics,Increased muscle spasms,Pain,Decreased activity tolerance,Decreased endurance,Decreased strength,Postural dysfunction,Increased edema,Decreased  balance  Visit Diagnosis: S/P right rotator cuff repair  Chronic right shoulder pain  Muscle weakness (generalized)  Localized edema     Problem List Patient Active Problem List   Diagnosis Date Noted  . Complete tear of right rotator cuff   . Biceps tendonitis on right     Garen Lah, PT, [facility] Certified Exercise Expert for the Aging Adult  01/01/21 8:50 AM Phone: [phone] Fax: [phone]  [facility] Outpatient Rehabilitation [facility] [address] [place], [place], [address] Phone: [phone]   Fax:  [phone]  Name: Linda [name] MRN: [id number] Date of Birth: 1986/01/10

## 2021-01-07 ENCOUNTER — Encounter: Payer: Medicaid Other | Admitting: Physical Therapy

## 2021-01-09 ENCOUNTER — Ambulatory Visit (INDEPENDENT_AMBULATORY_CARE_PROVIDER_SITE_OTHER): Payer: Medicaid Other | Admitting: Orthopedic Surgery

## 2021-01-09 ENCOUNTER — Ambulatory Visit: Payer: BC Managed Care – PPO | Admitting: Physical Therapy

## 2021-01-09 DIAGNOSIS — S46811A Strain of other muscles, fascia and tendons at shoulder and upper arm level, right arm, initial encounter: Secondary | ICD-10-CM

## 2021-01-10 ENCOUNTER — Encounter: Payer: Self-pay | Admitting: Orthopedic Surgery

## 2021-01-10 NOTE — Progress Notes (Signed)
   Post-Op Visit Note   Patient: Linda [name]           Date of Birth: May 23, 1986           MRN: [id number] Visit Date: 01/09/2021 PCP: System, Provider Not In   Assessment & Plan:  Chief Complaint:  Chief Complaint  Patient presents with  . Right Shoulder - Routine Post Op   Visit Diagnoses:  1. Infraspinatus tendon tear, right, initial encounter     Plan: Linda [name] is a patient now a month out right shoulder arthroscopy debridement mini open rotator cuff tear repair and biceps tenodesis.  She is doing well.  In therapy 1 time a week.  Starting 2 times a week next week.  She is off pain meds.  On exam she has improving range of motion and good strength.  Range of motion today is 30/80/115 passively.  No coarse grinding with passive range of motion of the shoulder.  Plan at this time is start strengthening 6 weeks postop and 4-week return for clinical recheck on range of motion.  Follow-Up Instructions: Return in about 4 weeks (around 02/06/2021).   Orders:  No orders of the defined types were placed in this encounter.  No orders of the defined types were placed in this encounter.   Imaging: No results found.  PMFS History: Patient Active Problem List   Diagnosis Date Noted  . Complete tear of right rotator cuff   . Biceps tendonitis on right    History reviewed. No pertinent past medical history.  Family History  Problem Relation Age of Onset  . Aneurysm Father     Past Surgical History:  Procedure Laterality Date  . SHOULDER ARTHROSCOPY WITH ROTATOR CUFF REPAIR AND SUBACROMIAL DECOMPRESSION Right 12/11/2020   Procedure: right shoulder arthroscopy, debridement, biceps tenodesis, mini open rotator cuff tear repair;  Surgeon: Cammy Copa, MD;  Location: [facility] OR;  Service: Orthopedics;  Laterality: Right;   Social History   Occupational History  . Not on file  Tobacco Use  . Smoking status: Never Smoker  . Smokeless tobacco: Never Used  Vaping Use  . Vaping Use:  Never used  Substance and Sexual Activity  . Alcohol use: Yes  . Drug use: No  . Sexual activity: Yes    Birth control/protection: None

## 2021-01-12 ENCOUNTER — Encounter: Payer: Medicaid Other | Admitting: Physical Therapy

## 2021-01-14 ENCOUNTER — Encounter: Payer: Medicaid Other | Admitting: Physical Therapy

## 2021-01-15 ENCOUNTER — Other Ambulatory Visit: Payer: Self-pay

## 2021-01-15 ENCOUNTER — Encounter: Payer: Self-pay | Admitting: Physical Therapy

## 2021-01-15 ENCOUNTER — Ambulatory Visit: Payer: BC Managed Care – PPO | Attending: Orthopedic Surgery | Admitting: Physical Therapy

## 2021-01-15 DIAGNOSIS — M25511 Pain in right shoulder: Secondary | ICD-10-CM | POA: Diagnosis present

## 2021-01-15 DIAGNOSIS — Z9889 Other specified postprocedural states: Secondary | ICD-10-CM | POA: Insufficient documentation

## 2021-01-15 DIAGNOSIS — R600 Localized edema: Secondary | ICD-10-CM | POA: Diagnosis present

## 2021-01-15 DIAGNOSIS — M6281 Muscle weakness (generalized): Secondary | ICD-10-CM | POA: Insufficient documentation

## 2021-01-15 DIAGNOSIS — G8929 Other chronic pain: Secondary | ICD-10-CM | POA: Diagnosis present

## 2021-01-15 NOTE — Therapy (Signed)
[facility] Outpatient Rehabilitation [facility] [address] [place], [place], [address] Phone: [phone]   Fax:  [phone]  Physical Therapy Treatment  Patient Details  Name: Linda [name] MRN: [id number] Date of Birth: [birth year] Referring Provider (PT): gregory scott dean MD   Encounter Date: 01/15/2021   PT End of Session - 01/15/21 0805    Visit Number 4    Number of Visits 17    Date for PT Re-Evaluation 02/18/21    Authorization Type [employer].    PT Start Time 0800    PT Stop Time 0830    PT Time Calculation (min) 30 min    Activity Tolerance Patient tolerated treatment well;No increased pain    Behavior During Therapy [facility] for tasks assessed/performed           History reviewed. No pertinent past medical history.  Past Surgical History:  Procedure Laterality Date  . SHOULDER ARTHROSCOPY WITH ROTATOR CUFF REPAIR AND SUBACROMIAL DECOMPRESSION Right 12/11/2020   Procedure: right shoulder arthroscopy, debridement, biceps tenodesis, mini open rotator cuff tear repair;  Surgeon: Cammy Copa, MD;  Location: [facility] OR;  Service: Orthopedics;  Laterality: Right;    There were no vitals filed for this visit.   Subjective Assessment - 01/15/21 0804    Subjective No more sling.  Pain very minimal, mostly at the R part of my neck and shoulder. Been doing my exercises .    Currently in Pain? No/denies              [facility] Adult PT Treatment/Exercise - 01/15/21 0001      Shoulder Exercises: Standing   Retraction Strengthening;20 reps;Theraband    Theraband Level (Shoulder Retraction) Level 2 (Red)      Shoulder Exercises: Pulleys   Flexion 2 minutes    Scaption 2 minutes    Scaption Limitations standing      Shoulder Exercises: Stretch   Table Stretch - Flexion 10 seconds    Table Stretch -Flexion Limitations x 10    Table Stretch - Abduction 10 seconds    Table Stretch - ABduction Limitations x 10    Table Stretch - External Rotation 10  seconds    Table Stretch - External Rotation Limitations x 10      Manual Therapy   Passive ROM all planes to tolerance, gentle jt distraction various planes                  PT Education - 01/15/21 0815    Education Details HEP, passive vs AAROM    Person(s) Educated Patient    Methods Explanation    Comprehension Returned demonstration;Verbalized understanding            PT Short Term Goals - 01/01/21 0848      PT SHORT TERM GOAL #1   Title pt to be IND with inital HEP    Baseline no previous HEP    Time 4    Period Weeks    Status On-going      PT SHORT TERM GOAL #2   Title increaes R shoulder AAROM flexion/ abduction to >/= 90 degrees for therapuetic progrssion    Baseline Abd 114 PROM  flexion 125  on Right PROM    Time 4    Period Weeks    Status Achieved             PT Long Term Goals - 12/24/20 0915      PT LONG TERM GOAL #1   Title  pt to increase R shoulder AROM to [facility] compared bil with </= 2/10 max pain for functional mobility required for ADLs    Baseline see flow sheet    Time 8    Period Weeks    Status New    Target Date 02/18/21      PT LONG TERM GOAL #2   Title increase Gross R shoulder strength to >/= 4+/5 to maximize stability and promote lifting mechanics    Baseline unable to assess at eval    Time 8    Period Weeks    Status New    Target Date 02/18/21      PT LONG TERM GOAL #3   Title pt to be able to lift/ lower from >/= 15# floor<>waist and >/= 8# from waist <>overhead with </= 2/10 max pain for functional lifting    Baseline unable to perfrom at eval    Time 8    Period Weeks    Status New    Target Date 02/18/21      PT LONG TERM GOAL #4   Title pt to be IND with all HEP given and is able to maintain and progress current LOF IND    Baseline no previous HEP    Time 8    Period Weeks    Status New    Target Date 02/18/21                 Plan - 01/15/21 0806    Clinical Impression Statement Patient late  for appt.  Used time to address ROM and compensation in Rt upper trap. She started the pulleys and was able to get to 137 deg AAROM. Strength OK at 6 weeks (5/14). No increased pain throughout session .    PT Treatment/Interventions ADLs/Self Care Home Management;Cryotherapy;Electrical Stimulation;Iontophoresis 4mg /ml Dexamethasone;Moist Heat;Ultrasound;Neuromuscular re-education;Therapeutic exercise;Therapeutic activities;Balance training;Patient/family education;Manual techniques;Passive range of motion;Vasopneumatic Device;Taping    PT Next Visit Plan review/ update HEP PRN, PROM / gentle GHJ mobs, scapular setting, modalities PRN vist # 4 pt will be in 4th week, progress HEP protocol    PT Home Exercise Plan [id number] - table slides flexion/ abduction, ER,  scapular retraction, upper trap stretch.    Consulted and Agree with Plan of Care Patient           Patient will benefit from skilled therapeutic intervention in order to improve the following deficits and impairments:  Improper body mechanics,Increased muscle spasms,Pain,Decreased activity tolerance,Decreased endurance,Decreased strength,Postural dysfunction,Increased edema,Decreased balance,Decreased range of motion  Visit Diagnosis: S/P right rotator cuff repair  Chronic right shoulder pain  Muscle weakness (generalized)  Localized edema     Problem List Patient Active Problem List   Diagnosis Date Noted  . Complete tear of right rotator cuff   . Biceps tendonitis on right     Linda [name] 01/15/2021, 8:35 AM  [facility] [address] [place], [place], [place] Phone: [phone]   Fax:  [phone]  Name: Linda [name] MRN: [place] Date of Birth: [birth year]  08/17/1986, PT 01/15/21 8:35 AM Phone: [phone] Fax: [phone]

## 2021-01-17 ENCOUNTER — Encounter: Payer: Self-pay | Admitting: Rehabilitative and Restorative Service Providers"

## 2021-01-17 ENCOUNTER — Other Ambulatory Visit: Payer: Self-pay

## 2021-01-17 ENCOUNTER — Ambulatory Visit: Payer: BC Managed Care – PPO | Admitting: Rehabilitative and Restorative Service Providers"

## 2021-01-17 DIAGNOSIS — R600 Localized edema: Secondary | ICD-10-CM

## 2021-01-17 DIAGNOSIS — Z9889 Other specified postprocedural states: Secondary | ICD-10-CM | POA: Diagnosis not present

## 2021-01-17 DIAGNOSIS — M6281 Muscle weakness (generalized): Secondary | ICD-10-CM

## 2021-01-17 DIAGNOSIS — M25511 Pain in right shoulder: Secondary | ICD-10-CM

## 2021-01-17 NOTE — Therapy (Addendum)
[place], [place], [address] Phone: [phone]   Fax:  [phone]  Physical Therapy Treatment/Discharge  Patient Details  Name: Linda [name] MRN: [id number] Date of Birth: 1985/11/19 Referring Provider (PT): gregory scott dean MD   Encounter Date: 01/17/2021   PT End of Session - 01/17/21 0832     Visit Number 5    Number of Visits 17    Date for PT Re-Evaluation 02/18/21    Authorization Type [employer].    PT Start Time [id number]    PT Stop Time 0900    PT Time Calculation (min) 44 min    Activity Tolerance Patient tolerated treatment well;No increased pain    Behavior During Therapy [facility] for tasks assessed/performed             History reviewed. No pertinent past medical history.  Past Surgical History:  Procedure Laterality Date   SHOULDER ARTHROSCOPY WITH ROTATOR CUFF REPAIR AND SUBACROMIAL DECOMPRESSION Right 12/11/2020   Procedure: right shoulder arthroscopy, debridement, biceps tenodesis, mini open rotator cuff tear repair;  Surgeon: Meredith Pel, MD;  Location: [facility];  Service: Orthopedics;  Laterality: Right;    There were no vitals filed for this visit.   Subjective Assessment - 01/17/21 0818     Subjective 0/10 pain.    Currently in Pain? No/denies    Pain Score 0-No pain                               [facility] Adult PT Treatment/Exercise - 01/17/21 0001       Shoulder Exercises: Supine   Other Supine Exercises dowel ER with palms up x 20 without pain; dowel bicep flex/ext x 20      Shoulder Exercises: Seated   Other Seated Exercises pulleys flex x 2 min, horiz abdct x 2 min, table slides into abdct x 15 and flexion x 15 each; scap retract R x 20      Manual Therapy   Passive ROM PROM all planes with and without oscillations needed                      PT Short Term Goals - 01/17/21 0840       PT SHORT TERM GOAL #1   Title pt to be  IND with inital HEP    Status On-going      PT SHORT TERM GOAL #2   Title increaes R shoulder AAROM flexion/ abduction to >/= 90 degrees for therapuetic progrssion    Status Achieved               PT Long Term Goals - 12/24/20 0915       PT LONG TERM GOAL #1   Title pt to increase R shoulder AROM to [facility] compared bil with </= 2/10 max pain for functional mobility required for ADLs    Baseline see flow sheet    Time 8    Period Weeks    Status New    Target Date 02/18/21      PT LONG TERM GOAL #2   Title increase Gross R shoulder strength to >/= 4+/5 to maximize stability and promote lifting mechanics    Baseline unable to assess at eval    Time 8    Period Weeks    Status New    Target Date 02/18/21      PT LONG TERM  GOAL #3   Title pt to be able to lift/ lower from >/= 15# floor<>waist and >/= 8# from waist <>overhead with </= 2/10 max pain for functional lifting    Baseline unable to perfrom at eval    Time 8    Period Weeks    Status New    Target Date 02/18/21      PT LONG TERM GOAL #4   Title pt to be IND with all HEP given and is able to maintain and progress current LOF IND    Baseline no previous HEP    Time 8    Period Weeks    Status New    Target Date 02/18/21                   Plan - 01/17/21 0832     Clinical Impression Statement Pt tolerated treatment without pain. She enjoys the pulleys. Follow RCR protocol. She is interested in buying a pulley from [place] but has not done just yet. Pt needed max verbal cues to relax with PROM. She has good PROM all directions without pain.    Rehab Potential Good    PT Frequency 2x / week    PT Duration 8 weeks    PT Treatment/Interventions ADLs/Self Care Home Management;Cryotherapy;Electrical Stimulation;Iontophoresis 25m/ml Dexamethasone;Moist Heat;Ultrasound;Neuromuscular re-education;Therapeutic exercise;Therapeutic activities;Balance training;Patient/family education;Manual techniques;Passive range  of motion;Vasopneumatic Device;Taping    PT Next Visit Plan PROM / gentle GHJ mobs, scapular setting, progress HEP protocol    Consulted and Agree with Plan of Care Patient             Patient will benefit from skilled therapeutic intervention in order to improve the following deficits and impairments:  Improper body mechanics,Increased muscle spasms,Pain,Decreased activity tolerance,Decreased endurance,Decreased strength,Postural dysfunction,Increased edema,Decreased balance,Decreased range of motion  Visit Diagnosis: S/P right rotator cuff repair  Chronic right shoulder pain  Muscle weakness (generalized)  Localized edema     Problem List Patient Active Problem List   Diagnosis Date Noted   Complete tear of right rotator cuff    Biceps tendonitis on right     NAmerica Brown PT 01/17/2021, 9:05 AM  [facility] [place] [address]Phone: [phone]  Fax:  [phone] Name: Linda DickmannMRN: [id number] of Birth: 1March 28, 1987 PHYSICAL THERAPY DISCHARGE SUMMARY  Visits from Start of Care: 5  Current functional level related to goals / functional outcomes: unknown   Remaining deficits: unknown   Education / Equipment: HEP  Patient agrees to discharge. Patient goals were partially met. Patient is being discharged due to not returning since the last visit.    NAmerica Brown PT, DPT

## 2021-01-19 ENCOUNTER — Telehealth: Payer: Self-pay

## 2021-01-19 ENCOUNTER — Ambulatory Visit: Payer: BC Managed Care – PPO

## 2021-01-19 NOTE — Telephone Encounter (Signed)
Left voicemail notifying patient of missed PT appointment. Reminded patient of next scheduled visit and to call if she would need to reschedule.

## 2021-01-21 ENCOUNTER — Telehealth: Payer: Self-pay | Admitting: Physical Therapy

## 2021-01-21 ENCOUNTER — Ambulatory Visit: Payer: BC Managed Care – PPO | Admitting: Physical Therapy

## 2021-01-21 NOTE — Telephone Encounter (Signed)
Called patient to inquire about her missed appt this AM.  Unable to leave message, mailbox full.  Patient has no other PT appts.  Sees Dr. August Saucer 5/26.  Karie Mainland, PT 01/21/21 8:57 AM Phone: [phone] Fax: [phone]

## 2021-02-05 ENCOUNTER — Ambulatory Visit: Payer: Medicaid Other | Admitting: Orthopedic Surgery

## 2021-11-22 IMAGING — MR MR SHOULDER*R* W/CM
5 series · 38 of 40 positions shown · IV contrast (agent unspecified)
Comparison: Right shoulder radiographs 09/16/2020

CLINICAL DATA: Right shoulder pain.

EXAM:
MR ARTHROGRAM OF THE right SHOULDER
TECHNIQUE: Multiplanar, multisequence MR imaging of the right shoulder was
performed following the administration of intra-articular contrast.
CONTRAST:  See Injection Documentation.

[Series 3: T1 fat-sat · axial · 4.0mm · 0.27mm/px · z∈[-42,+56]mm · 11 of 21 slices shown (1 of 3)]
[im 1/21]
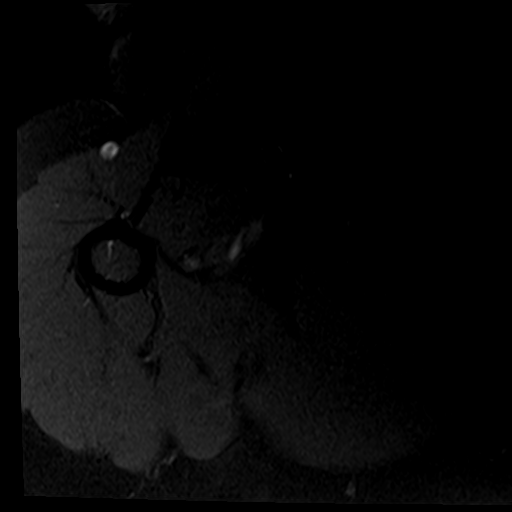
[im 3/21]
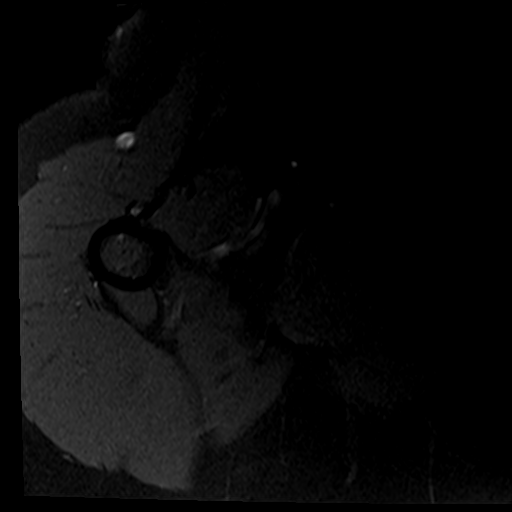
[im 5/21]
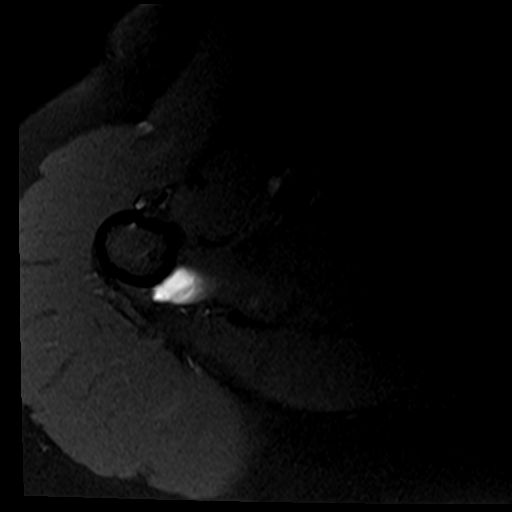
[im 7/21]
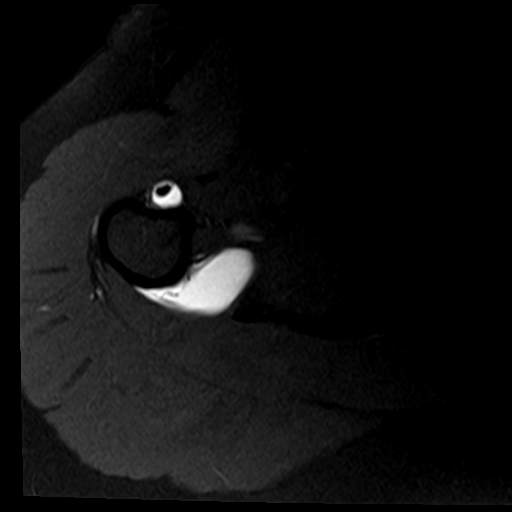
[im 9/21]
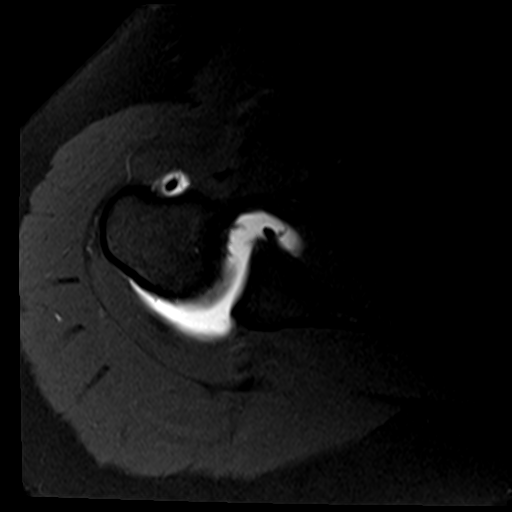
[im 11/21]
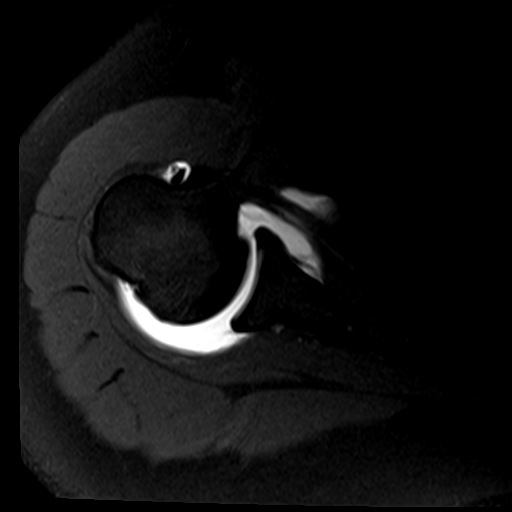
[im 13/21]
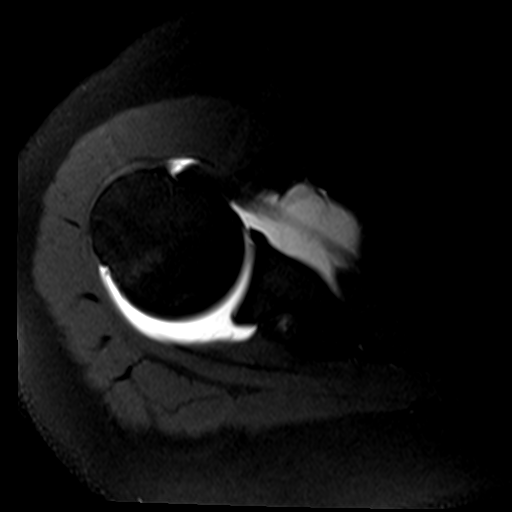
[im 15/21]
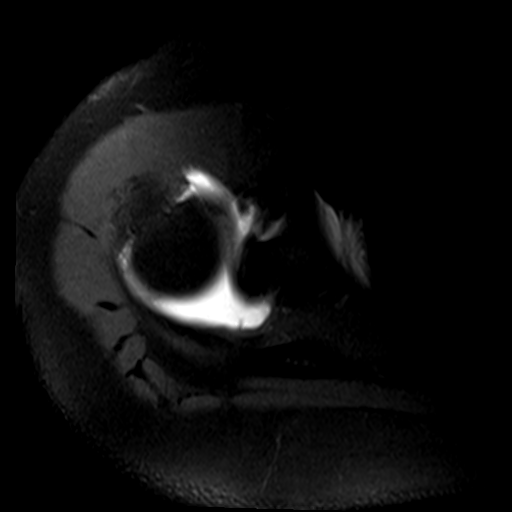
[im 17/21]
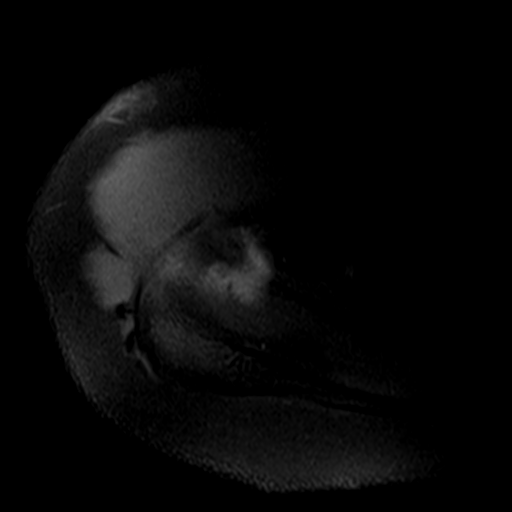
[im 19/21]
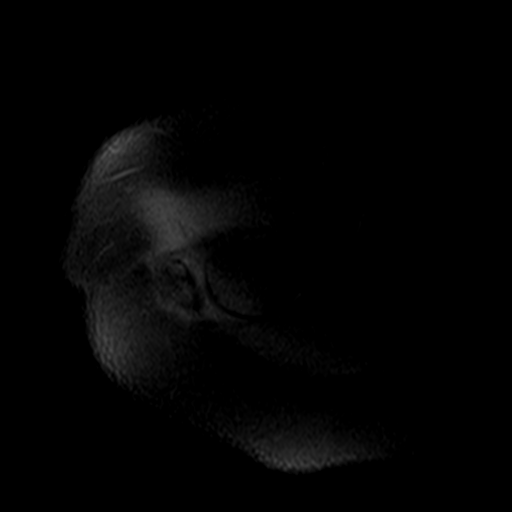
[im 21/21]
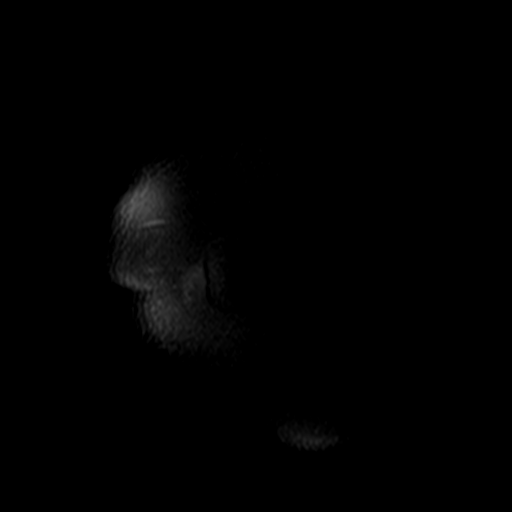

[Series 4: T2 fat-sat · coronal · 4.0mm · 0.55mm/px · 8 of 18 slices shown (1 of 2)]
[im 1/18]
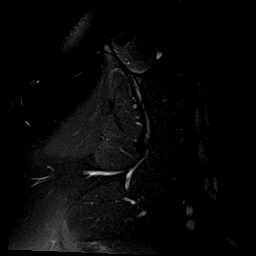
[im 3/18]
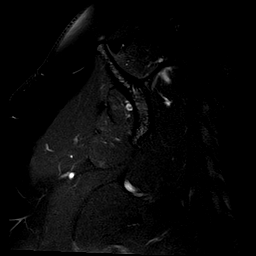
[im 5/18]
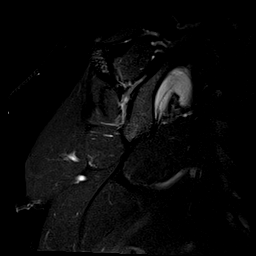
[im 8/18]
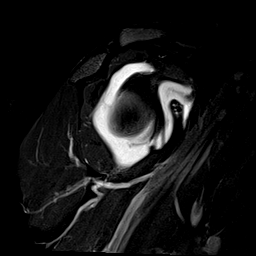
[im 10/18]
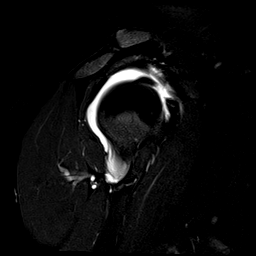
[im 13/18]
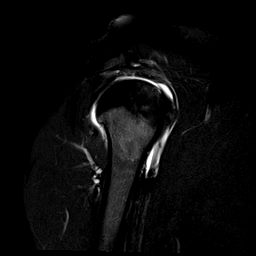
[im 15/18]
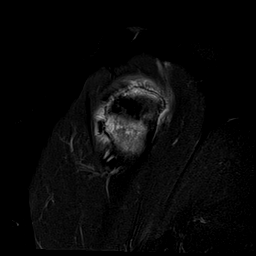
[im 18/18]
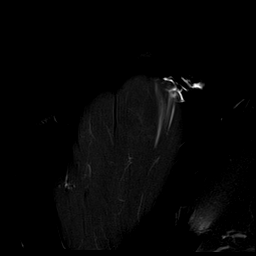

[Series 5: T1 fat-sat · oblique · 4.0mm · 0.55mm/px · 7 of 16 slices shown (2 of 3)]
[im 1/16]
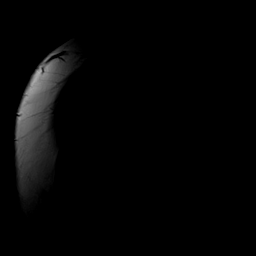
[im 3/16]
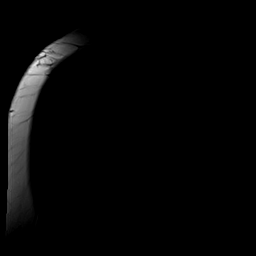
[im 6/16]
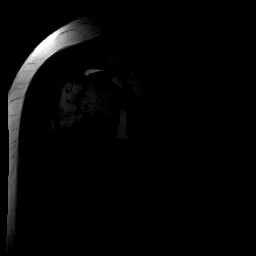
[im 8/16]
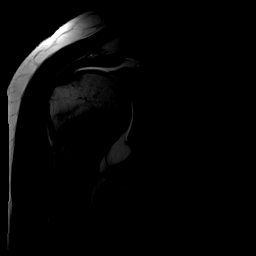
[im 11/16]
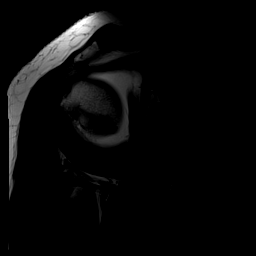
[im 13/16]
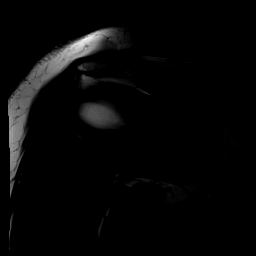
[im 16/16]
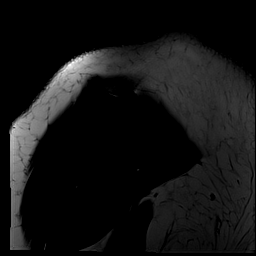

[Series 6: T1 fat-sat · oblique · 4.0mm · 0.55mm/px · 5 of 16 slices shown (3 of 3)]
[im 1/16]
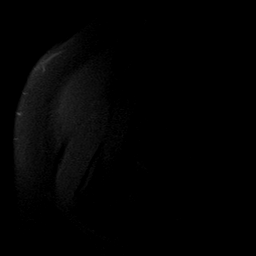
[im 3/16]
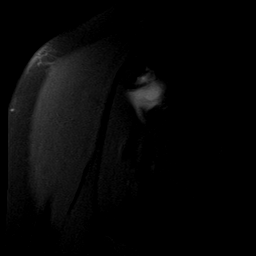
[im 6/16]
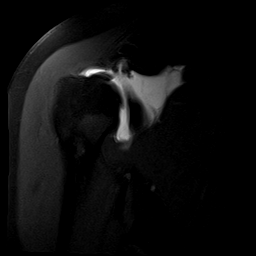
[im 8/16]
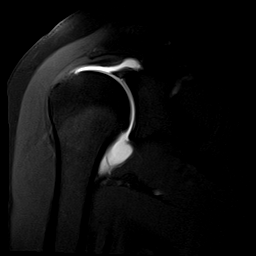
[im 11/16]
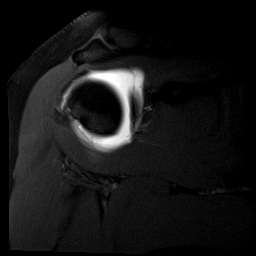

[Series 7: T2 fat-sat · oblique · 4.0mm · 0.55mm/px · 7 of 16 slices shown (2 of 2)]
[im 1/16]
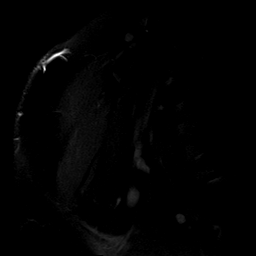
[im 3/16]
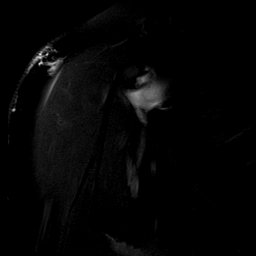
[im 6/16]
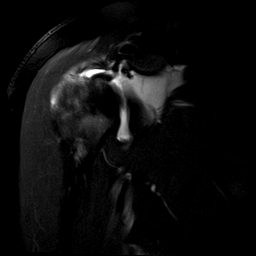
[im 8/16]
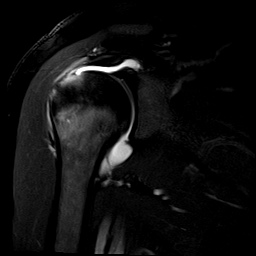
[im 11/16]
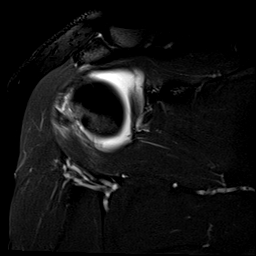
[im 13/16]
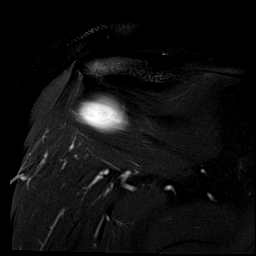
[im 16/16]
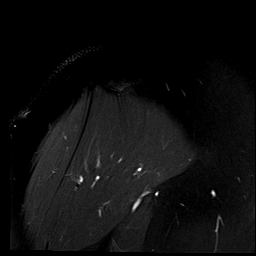

[38 of 40 positions shown; findings below may reference images not displayed]

FINDINGS: Rotator cuff: Significant thinning and partial-thickness articular
surface tearing of the supraspinatus and infraspinatus tendons. No
definite full-thickness tear. The subscapularis tendon appears
normal.

Muscles: No significant findings.

Biceps long head: Intact

Acromioclavicular Joint: Type 1 in shape. There is mild lateral
downsloping but no subacromial spurring.

Glenohumeral Joint: Normal articular cartilage. Joint capsule is
quite capacious and distended.

Labrum: The glenoid labrum and glenohumeral ligaments are intact.

Bones: No acute bony findings.
IMPRESSION: 1. Significant thinning and partial-thickness articular surface
tearing of the supraspinatus and infraspinatus tendons. No definite
full-thickness tear.
2. Intact long head biceps tendon, glenoid labrum and glenohumeral
ligaments.
3. Mild lateral downsloping of a type 1 acromion but no subacromial
spurring.
4. Capacious and distended joint capsule.
